# Patient Record
Sex: Female | Born: 1950 | Race: White | Hispanic: No | Marital: Married | Smoking: Former smoker
Health system: Southern US, Community
[De-identification: ages and names within clinical notes are randomized; demographics above are authoritative.]

## PROBLEM LIST (undated history)

## (undated) DIAGNOSIS — Z8659 Personal history of other mental and behavioral disorders: Secondary | ICD-10-CM

## (undated) DIAGNOSIS — M8580 Other specified disorders of bone density and structure, unspecified site: Secondary | ICD-10-CM

## (undated) DIAGNOSIS — E211 Secondary hyperparathyroidism, not elsewhere classified: Secondary | ICD-10-CM

## (undated) DIAGNOSIS — E785 Hyperlipidemia, unspecified: Secondary | ICD-10-CM

## (undated) DIAGNOSIS — M1990 Unspecified osteoarthritis, unspecified site: Secondary | ICD-10-CM

## (undated) HISTORY — DX: Hyperlipidemia, unspecified: E78.5

## (undated) HISTORY — PX: BUNIONECTOMY: SHX129

## (undated) HISTORY — DX: Secondary hyperparathyroidism, not elsewhere classified: E21.1

## (undated) HISTORY — PX: TONSILLECTOMY: SUR1361

## (undated) HISTORY — DX: Personal history of other mental and behavioral disorders: Z86.59

## (undated) HISTORY — DX: Other specified disorders of bone density and structure, unspecified site: M85.80

## (undated) HISTORY — PX: BREAST BIOPSY: SHX20

---

## 2007-05-02 ENCOUNTER — Other Ambulatory Visit: Admission: RE | Admit: 2007-05-02 | Discharge: 2007-05-02 | Payer: Self-pay | Admitting: Family Medicine

## 2007-06-07 ENCOUNTER — Encounter: Admission: RE | Admit: 2007-06-07 | Discharge: 2007-06-07 | Payer: Self-pay | Admitting: Family Medicine

## 2007-08-09 ENCOUNTER — Encounter: Admission: RE | Admit: 2007-08-09 | Discharge: 2007-08-09 | Payer: Self-pay | Admitting: Endocrinology

## 2008-05-02 ENCOUNTER — Other Ambulatory Visit: Admission: RE | Admit: 2008-05-02 | Discharge: 2008-05-02 | Payer: Self-pay | Admitting: Family Medicine

## 2008-05-30 HISTORY — PX: BREAST BIOPSY: SHX20

## 2008-07-16 ENCOUNTER — Encounter: Admission: RE | Admit: 2008-07-16 | Discharge: 2008-07-16 | Payer: Self-pay | Admitting: Family Medicine

## 2009-06-02 ENCOUNTER — Other Ambulatory Visit: Admission: RE | Admit: 2009-06-02 | Discharge: 2009-06-02 | Payer: Self-pay | Admitting: Family Medicine

## 2011-12-20 ENCOUNTER — Other Ambulatory Visit: Payer: Self-pay | Admitting: Family Medicine

## 2011-12-20 DIAGNOSIS — Z1231 Encounter for screening mammogram for malignant neoplasm of breast: Secondary | ICD-10-CM

## 2012-01-04 ENCOUNTER — Ambulatory Visit
Admission: RE | Admit: 2012-01-04 | Discharge: 2012-01-04 | Disposition: A | Discharge status: Home / Self Care | Payer: BC Managed Care – PPO | Source: Ambulatory Visit | Attending: Family Medicine | Admitting: Family Medicine

## 2012-01-04 ENCOUNTER — Other Ambulatory Visit: Payer: Self-pay | Admitting: Surgery

## 2012-01-04 DIAGNOSIS — Z1231 Encounter for screening mammogram for malignant neoplasm of breast: Secondary | ICD-10-CM

## 2012-01-09 ENCOUNTER — Other Ambulatory Visit: Payer: Self-pay | Admitting: Surgery

## 2012-01-09 DIAGNOSIS — R928 Other abnormal and inconclusive findings on diagnostic imaging of breast: Secondary | ICD-10-CM

## 2012-01-16 ENCOUNTER — Other Ambulatory Visit: Payer: Self-pay | Admitting: Surgery

## 2012-01-16 ENCOUNTER — Ambulatory Visit
Admission: RE | Admit: 2012-01-16 | Discharge: 2012-01-16 | Disposition: A | Discharge status: Home / Self Care | Payer: BC Managed Care – PPO | Source: Ambulatory Visit | Attending: Surgery | Admitting: Surgery

## 2012-01-16 DIAGNOSIS — N63 Unspecified lump in unspecified breast: Secondary | ICD-10-CM

## 2012-01-16 DIAGNOSIS — R928 Other abnormal and inconclusive findings on diagnostic imaging of breast: Secondary | ICD-10-CM

## 2012-01-18 ENCOUNTER — Other Ambulatory Visit: Payer: Self-pay | Admitting: Family Medicine

## 2012-01-18 DIAGNOSIS — R928 Other abnormal and inconclusive findings on diagnostic imaging of breast: Secondary | ICD-10-CM

## 2012-01-18 DIAGNOSIS — Z1231 Encounter for screening mammogram for malignant neoplasm of breast: Secondary | ICD-10-CM

## 2012-01-18 DIAGNOSIS — N63 Unspecified lump in unspecified breast: Secondary | ICD-10-CM

## 2012-01-19 ENCOUNTER — Ambulatory Visit
Admission: RE | Admit: 2012-01-19 | Discharge: 2012-01-19 | Disposition: A | Discharge status: Home / Self Care | Payer: BC Managed Care – PPO | Source: Ambulatory Visit | Attending: Surgery | Admitting: Surgery

## 2012-01-19 ENCOUNTER — Ambulatory Visit
Admission: RE | Admit: 2012-01-19 | Discharge: 2012-01-19 | Disposition: A | Discharge status: Home / Self Care | Payer: BC Managed Care – PPO | Source: Ambulatory Visit | Attending: Family Medicine | Admitting: Family Medicine

## 2012-01-19 ENCOUNTER — Other Ambulatory Visit: Payer: Self-pay | Admitting: Family Medicine

## 2012-01-19 DIAGNOSIS — N63 Unspecified lump in unspecified breast: Secondary | ICD-10-CM

## 2012-01-20 ENCOUNTER — Inpatient Hospital Stay: Admission: RE | Admit: 2012-01-20 | Payer: BC Managed Care – PPO | Source: Ambulatory Visit

## 2012-11-27 ENCOUNTER — Other Ambulatory Visit: Payer: Self-pay | Admitting: Family Medicine

## 2012-11-27 DIAGNOSIS — R928 Other abnormal and inconclusive findings on diagnostic imaging of breast: Secondary | ICD-10-CM

## 2013-01-04 ENCOUNTER — Ambulatory Visit
Admission: RE | Admit: 2013-01-04 | Discharge: 2013-01-04 | Disposition: A | Discharge status: Home / Self Care | Payer: BC Managed Care – PPO | Source: Ambulatory Visit | Attending: Family Medicine | Admitting: Family Medicine

## 2013-01-04 DIAGNOSIS — R928 Other abnormal and inconclusive findings on diagnostic imaging of breast: Secondary | ICD-10-CM

## 2014-01-07 ENCOUNTER — Other Ambulatory Visit: Payer: Self-pay

## 2014-01-07 DIAGNOSIS — Z1231 Encounter for screening mammogram for malignant neoplasm of breast: Secondary | ICD-10-CM

## 2014-01-15 ENCOUNTER — Ambulatory Visit
Admission: RE | Admit: 2014-01-15 | Discharge: 2014-01-15 | Disposition: A | Discharge status: Home / Self Care | Payer: Medicare Other | Source: Ambulatory Visit

## 2014-01-15 DIAGNOSIS — Z1231 Encounter for screening mammogram for malignant neoplasm of breast: Secondary | ICD-10-CM

## 2014-12-30 ENCOUNTER — Other Ambulatory Visit: Payer: Self-pay

## 2014-12-30 DIAGNOSIS — Z1231 Encounter for screening mammogram for malignant neoplasm of breast: Secondary | ICD-10-CM

## 2015-01-18 ENCOUNTER — Encounter (HOSPITAL_COMMUNITY): Payer: Self-pay | Admitting: Emergency Medicine

## 2015-01-18 ENCOUNTER — Emergency Department (HOSPITAL_COMMUNITY): Payer: Medicare Other

## 2015-01-18 ENCOUNTER — Emergency Department (HOSPITAL_COMMUNITY)
Admission: EM | Admit: 2015-01-18 | Discharge: 2015-01-18 | Disposition: A | Discharge status: Home / Self Care | Payer: Medicare Other | Attending: Emergency Medicine | Admitting: Emergency Medicine

## 2015-01-18 DIAGNOSIS — S90811A Abrasion, right foot, initial encounter: Secondary | ICD-10-CM | POA: Diagnosis not present

## 2015-01-18 DIAGNOSIS — Y998 Other external cause status: Secondary | ICD-10-CM | POA: Diagnosis not present

## 2015-01-18 DIAGNOSIS — Z23 Encounter for immunization: Secondary | ICD-10-CM | POA: Insufficient documentation

## 2015-01-18 DIAGNOSIS — Z87891 Personal history of nicotine dependence: Secondary | ICD-10-CM | POA: Diagnosis not present

## 2015-01-18 DIAGNOSIS — Y9289 Other specified places as the place of occurrence of the external cause: Secondary | ICD-10-CM | POA: Diagnosis not present

## 2015-01-18 DIAGNOSIS — S99921A Unspecified injury of right foot, initial encounter: Secondary | ICD-10-CM | POA: Diagnosis present

## 2015-01-18 DIAGNOSIS — W228XXA Striking against or struck by other objects, initial encounter: Secondary | ICD-10-CM | POA: Insufficient documentation

## 2015-01-18 DIAGNOSIS — Y9389 Activity, other specified: Secondary | ICD-10-CM | POA: Diagnosis not present

## 2015-01-18 DIAGNOSIS — T148XXA Other injury of unspecified body region, initial encounter: Secondary | ICD-10-CM

## 2015-01-18 DIAGNOSIS — Z79899 Other long term (current) drug therapy: Secondary | ICD-10-CM | POA: Insufficient documentation

## 2015-01-18 MED ORDER — TETANUS-DIPHTH-ACELL PERTUSSIS 5-2.5-18.5 LF-MCG/0.5 IM SUSP
0.5000 mL | Freq: Once | INTRAMUSCULAR | Status: AC
Start: 2015-01-18 — End: 2015-01-18
  Administered 2015-01-18: 0.5 mL via INTRAMUSCULAR
  Filled 2015-01-18: qty 0.5

## 2015-01-18 NOTE — Discharge Instructions (Signed)
Abrasions Cheryl Mcpherson, your xray does not show any broken bones or misalignment of the hardware in your foot.  Take a tylenol or ibuprofen as needed for pain control.  See a primary care doctor within 3 days for close follow up. If any symptoms worsen, come back to the ED immediately. Thank you. An abrasion is a cut or scrape of the skin. Abrasions do not go through all layers of the skin. HOME CARE  If a bandage (dressing) was put on your wound, change it as told by your doctor. If the bandage sticks, soak it off with warm.  Wash the area with water and soap 2 times a day. Rinse off the soap. Pat the area dry with a clean towel.  Put on medicated cream (ointment) as told by your doctor.  Change your bandage right away if it gets wet or dirty.  Only take medicine as told by your doctor.  See your doctor within 24-48 hours to get your wound checked.  Check your wound for redness, puffiness (swelling), or yellowish-white fluid (pus). GET HELP RIGHT AWAY IF:   You have more pain in the wound.  You have redness, swelling, or tenderness around the wound.  You have pus coming from the wound.  You have a fever or lasting symptoms for more than 2-3 days.  You have a fever and your symptoms suddenly get worse.  You have a bad smell coming from the wound or bandage. MAKE SURE YOU:   Understand these instructions.  Will watch your condition.  Will get help right away if you are not doing well or get worse. Document Released: 11/02/2007 Document Revised: 02/08/2012 Document Reviewed: 04/19/2011 [facility] Patient Information 2015 [place], [place]. This information is not intended to replace advice given to you by your health care provider. Make sure you discuss any questions you have with your health care provider.

## 2015-01-18 NOTE — ED Provider Notes (Signed)
CSN: [id number]     Arrival date & time 01/18/15  0422 History   First MD Initiated Contact with Patient 01/18/15 [id number]     Chief Complaint  Patient presents with  . Foot Pain     (Consider location/radiation/quality/duration/timing/severity/associated sxs/prior Treatment) HPI   Ms. Tabak is a 64yo female, no sig PMH, here with foot pain.  Around 6pm, she kicked a ball and has a small lac on her right foot.  She had surgery in 2007 with screws and is concerned that they may have poked out through the skin.  She did not take any medicines for this.  She denies other injuries, patient has no further complaints.   10 Systems reviewed and are negative for acute change except as noted in the HPI.     History reviewed. No pertinent past medical history. Past Surgical History  Procedure Laterality Date  . Bunionectomy Bilateral Rt foot 11/2005; left foot 06/2006   No family history on file. Social History  Substance Use Topics  . Smoking status: Former Smoker    Quit date: 12/29/2007  . Smokeless tobacco: None  . Alcohol Use: Yes     Comment: rarely   OB History    No data available     Review of Systems    Allergies  Sulfa antibiotics  Home Medications   Prior to Admission medications   Medication Sig Start Date End Date Taking? Authorizing Provider  simvastatin (ZOCOR) 40 MG tablet Take 40 mg by mouth daily.   Yes Historical Provider, MD   BP 132/72 mmHg  Pulse 68  Temp(Src) 97.3 F (36.3 C) (Oral)  Ht 5\' 7"  (1.702 m)  Wt 205 lb (92.987 kg)  BMI 32.10 kg/m2  SpO2 95%  LMP 12/28/1996 Physical Exam  Constitutional: She is oriented to person, place, and time. She appears well-developed and well-nourished. No distress.  HENT:  Head: Normocephalic and atraumatic.  Nose: Nose normal.  Mouth/Throat: Oropharynx is clear and moist. No oropharyngeal exudate.  Eyes: Conjunctivae and EOM are normal. Pupils are equal, round, and reactive to light. No scleral icterus.   Neck: Normal range of motion. Neck supple. No JVD present. No tracheal deviation present. No thyromegaly present.  Cardiovascular: Normal rate, regular rhythm and normal heart sounds.  Exam reveals no gallop and no friction rub.   No murmur heard. Pulmonary/Chest: Effort normal and breath sounds normal. No respiratory distress. She has no wheezes. She exhibits no tenderness.  Abdominal: Soft. Bowel sounds are normal. She exhibits no distension and no mass. There is no tenderness. There is no rebound and no guarding.  Musculoskeletal: Normal range of motion. She exhibits no edema or tenderness.  Small 0.5cm lac to dorsum of right foot, no sig swelling or hematoma formation.  Normal pulses and sensation distally.  No TTP.  Lymphadenopathy:    She has no cervical adenopathy.  Neurological: She is alert and oriented to person, place, and time. No cranial nerve deficit. She exhibits normal muscle tone.  Skin: Skin is warm and dry. No rash noted. No erythema. No pallor.  Nursing note and vitals reviewed.   ED Course  Procedures (including critical care time) Labs Review Labs Reviewed - No data to display  Imaging Review Dg Foot Complete Right  01/18/2015   CLINICAL DATA:  RIGHT foot pain after kicking a ball in yard with dog. Prior foot surgery.  EXAM: RIGHT FOOT COMPLETE - 3+ VIEW  COMPARISON:  None.  FINDINGS: No acute fracture deformity or dislocation.  Status post distal first metatarsus osteotomy/bunionectomy. Cerclage wire projecting at first proximal phalanx, status post osteotomy. Two intact partially threaded cannulated screws and base of first metatarsus. No dislocation. Small plantar calcaneal spur. No destructive bony lesions. Soft tissue planes are nonsuspicious.  IMPRESSION: No acute fracture deformity or dislocation.  Status post first metatarsus and first proximal phalanx osteotomies.   Electronically Signed   By: Elon Alas M.D.   On: 01/18/2015 05:33   I have personally  reviewed and evaluated these images and lab results as part of my medical decision-making.   EKG Interpretation None      MDM   Final diagnoses:  None   Patient presents to the ED for a cut on her foot after kicking a ball.  Xr is negative for fracture or misalignment of hardware. Tetanus was updated.  She appears well and in NAD.  Her VS remain wtihin her normal limits and she is safe for DC.      Everlene Balls, MD 01/18/15 [phone]

## 2015-01-18 NOTE — ED Notes (Signed)
Patient states she was playing out in back yard with dog and kicked her ball. Patient states that her foot made contact with filling stem and she felt immediate pain with a skin break at the site. Patient states that she has had bunion surgery in the past and has a screw in her foot. Patient states she iced injury and took 3 500 mg ibuprofen after.

## 2015-01-23 ENCOUNTER — Ambulatory Visit
Admission: RE | Admit: 2015-01-23 | Discharge: 2015-01-23 | Disposition: A | Discharge status: Home / Self Care | Payer: Medicare Other | Source: Ambulatory Visit

## 2015-01-23 DIAGNOSIS — Z1231 Encounter for screening mammogram for malignant neoplasm of breast: Secondary | ICD-10-CM

## 2015-09-11 ENCOUNTER — Other Ambulatory Visit: Payer: Self-pay | Admitting: Emergency Medicine

## 2015-09-11 DIAGNOSIS — N950 Postmenopausal bleeding: Secondary | ICD-10-CM

## 2015-09-16 ENCOUNTER — Ambulatory Visit
Admission: RE | Admit: 2015-09-16 | Discharge: 2015-09-16 | Disposition: A | Discharge status: Home / Self Care | Payer: Medicare Other | Source: Ambulatory Visit | Attending: Emergency Medicine | Admitting: Emergency Medicine

## 2015-09-16 DIAGNOSIS — N950 Postmenopausal bleeding: Secondary | ICD-10-CM

## 2015-09-23 ENCOUNTER — Other Ambulatory Visit: Payer: Self-pay | Admitting: Obstetrics and Gynecology

## 2015-12-22 ENCOUNTER — Other Ambulatory Visit: Payer: Self-pay | Admitting: Family Medicine

## 2015-12-22 DIAGNOSIS — Z1231 Encounter for screening mammogram for malignant neoplasm of breast: Secondary | ICD-10-CM

## 2016-01-26 ENCOUNTER — Ambulatory Visit: Payer: Medicare Other

## 2016-01-28 ENCOUNTER — Ambulatory Visit
Admission: RE | Admit: 2016-01-28 | Discharge: 2016-01-28 | Disposition: A | Discharge status: Home / Self Care | Payer: Medicare Other | Source: Ambulatory Visit | Attending: Family Medicine | Admitting: Family Medicine

## 2016-01-28 DIAGNOSIS — Z1231 Encounter for screening mammogram for malignant neoplasm of breast: Secondary | ICD-10-CM

## 2017-01-04 ENCOUNTER — Other Ambulatory Visit: Payer: Self-pay | Admitting: Family Medicine

## 2017-01-04 DIAGNOSIS — Z1231 Encounter for screening mammogram for malignant neoplasm of breast: Secondary | ICD-10-CM

## 2017-01-31 ENCOUNTER — Ambulatory Visit
Admission: RE | Admit: 2017-01-31 | Discharge: 2017-01-31 | Disposition: A | Discharge status: Home / Self Care | Payer: Medicare Other | Source: Ambulatory Visit | Attending: Family Medicine | Admitting: Family Medicine

## 2017-01-31 DIAGNOSIS — Z1231 Encounter for screening mammogram for malignant neoplasm of breast: Secondary | ICD-10-CM

## 2017-04-29 ENCOUNTER — Other Ambulatory Visit: Payer: Self-pay | Admitting: Family Medicine

## 2017-04-29 DIAGNOSIS — R590 Localized enlarged lymph nodes: Secondary | ICD-10-CM

## 2017-04-29 HISTORY — PX: BREAST BIOPSY: SHX20

## 2017-05-05 ENCOUNTER — Ambulatory Visit
Admission: RE | Admit: 2017-05-05 | Discharge: 2017-05-05 | Disposition: A | Discharge status: Home / Self Care | Payer: Medicare Other | Source: Ambulatory Visit | Attending: Family Medicine | Admitting: Family Medicine

## 2017-05-05 ENCOUNTER — Other Ambulatory Visit: Payer: Self-pay | Admitting: Family Medicine

## 2017-05-05 DIAGNOSIS — R590 Localized enlarged lymph nodes: Secondary | ICD-10-CM

## 2017-05-12 ENCOUNTER — Other Ambulatory Visit (HOSPITAL_COMMUNITY)
Admission: RE | Admit: 2017-05-12 | Discharge: 2017-05-12 | Disposition: A | Discharge status: Home / Self Care | Payer: Medicare Other | Source: Ambulatory Visit | Attending: Body Imaging | Admitting: Body Imaging

## 2017-05-12 ENCOUNTER — Ambulatory Visit
Admission: RE | Admit: 2017-05-12 | Discharge: 2017-05-12 | Disposition: A | Discharge status: Home / Self Care | Payer: Medicare Other | Source: Ambulatory Visit | Attending: Family Medicine | Admitting: Family Medicine

## 2017-05-12 ENCOUNTER — Other Ambulatory Visit: Payer: Self-pay | Admitting: Family Medicine

## 2017-05-12 DIAGNOSIS — R590 Localized enlarged lymph nodes: Secondary | ICD-10-CM

## 2018-01-04 ENCOUNTER — Other Ambulatory Visit: Payer: Self-pay | Admitting: Physician Assistant

## 2018-01-04 DIAGNOSIS — Z1231 Encounter for screening mammogram for malignant neoplasm of breast: Secondary | ICD-10-CM

## 2018-02-01 ENCOUNTER — Ambulatory Visit
Admission: RE | Admit: 2018-02-01 | Discharge: 2018-02-01 | Disposition: A | Discharge status: Home / Self Care | Payer: Medicare Other | Source: Ambulatory Visit | Attending: Physician Assistant | Admitting: Physician Assistant

## 2018-02-01 DIAGNOSIS — Z1231 Encounter for screening mammogram for malignant neoplasm of breast: Secondary | ICD-10-CM

## 2018-02-01 NOTE — Pre-Procedure Instructions (Signed)
Cheryl Mcpherson  02/01/2018      [id number] - [place], [place] - [address] AT [place] [address] [place]  [address] Phone: [phone] Fax: [phone]    Your procedure is scheduled on Thursday September 12th.  Report to [facility] at 0800 A.M.  Call this number if you have problems the morning of surgery:  [phone]   Remember:  Do not eat or drink after midnight.    Do not take any medications the morning of surgery.  7 days prior to surgery STOP taking any Aspirin(unless otherwise instructed by your surgeon), Aleve, Naproxen, Ibuprofen, Motrin, Advil, Goody's, BC's, all herbal medications, fish oil, and all vitamins     Do not wear jewelry, make-up or nail polish.  Do not wear lotions, powders, or perfumes, or deodorant.  Do not shave 48 hours prior to surgery.  Men may shave face and neck.  Do not bring valuables to the hospital.  [facility] is not responsible for any belongings or valuables.  Contacts, dentures or bridgework may not be worn into surgery.  Leave your suitcase in the car.  After surgery it may be brought to your room.  For patients admitted to the hospital, discharge time will be determined by your treatment team.  Patients discharged the day of surgery will not be allowed to drive home.    [place]- Preparing For Surgery  Before surgery, you can play an important role. Because skin is not sterile, your skin needs to be as free of germs as possible. You can reduce the number of germs on your skin by washing with CHG (chlorahexidine gluconate) Soap before surgery.  CHG is an antiseptic cleaner which kills germs and bonds with the skin to continue killing germs even after washing.    Oral Hygiene is also important to reduce your risk of infection.  Remember - BRUSH YOUR TEETH THE MORNING OF SURGERY WITH YOUR REGULAR TOOTHPASTE  Please do not use if you have an allergy to CHG or antibacterial  soaps. If your skin becomes reddened/irritated stop using the CHG.  Do not shave (including legs and underarms) for at least 48 hours prior to first CHG shower. It is OK to shave your face.  Please follow these instructions carefully.   1. Shower the NIGHT BEFORE SURGERY and the MORNING OF SURGERY with CHG.   2. If you chose to wash your hair, wash your hair first as usual with your normal shampoo.  3. After you shampoo, rinse your hair and body thoroughly to remove the shampoo.  4. Use CHG as you would any other liquid soap. You can apply CHG directly to the skin and wash gently with a scrungie or a clean washcloth.   5. Apply the CHG Soap to your body ONLY FROM THE NECK DOWN.  Do not use on open wounds or open sores. Avoid contact with your eyes, ears, mouth and genitals (private parts). Wash Face and genitals (private parts)  with your normal soap.  6. Wash thoroughly, paying special attention to the area where your surgery will be performed.  7. Thoroughly rinse your body with warm water from the neck down.  8. DO NOT shower/wash with your normal soap after using and rinsing off the CHG Soap.  9. Pat yourself dry with a CLEAN TOWEL.  10. Wear CLEAN PAJAMAS to bed the night before surgery, wear comfortable clothes the morning of surgery  11.  Place CLEAN SHEETS on your bed the night of your first shower and DO NOT SLEEP WITH PETS.    Day of Surgery:  Do not apply any deodorants/lotions.  Please wear clean clothes to the hospital/surgery center.   Remember to brush your teeth WITH YOUR REGULAR TOOTHPASTE.    Please read over the following fact sheets that you were given.

## 2018-02-02 ENCOUNTER — Encounter (HOSPITAL_COMMUNITY): Payer: Self-pay

## 2018-02-02 ENCOUNTER — Encounter (HOSPITAL_COMMUNITY)
Admission: RE | Admit: 2018-02-02 | Discharge: 2018-02-02 | Disposition: A | Discharge status: Home / Self Care | Payer: Medicare Other | Source: Ambulatory Visit | Attending: Orthopedic Surgery | Admitting: Orthopedic Surgery

## 2018-02-02 ENCOUNTER — Other Ambulatory Visit: Payer: Self-pay

## 2018-02-02 DIAGNOSIS — Z79899 Other long term (current) drug therapy: Secondary | ICD-10-CM | POA: Insufficient documentation

## 2018-02-02 DIAGNOSIS — Z7982 Long term (current) use of aspirin: Secondary | ICD-10-CM | POA: Diagnosis not present

## 2018-02-02 DIAGNOSIS — M75101 Unspecified rotator cuff tear or rupture of right shoulder, not specified as traumatic: Secondary | ICD-10-CM | POA: Diagnosis not present

## 2018-02-02 DIAGNOSIS — Z87891 Personal history of nicotine dependence: Secondary | ICD-10-CM | POA: Diagnosis not present

## 2018-02-02 DIAGNOSIS — Z01812 Encounter for preprocedural laboratory examination: Secondary | ICD-10-CM | POA: Diagnosis present

## 2018-02-02 HISTORY — DX: Unspecified osteoarthritis, unspecified site: M19.90

## 2018-02-02 LAB — SURGICAL PCR SCREEN
MRSA, PCR: NEGATIVE
STAPHYLOCOCCUS AUREUS: NEGATIVE

## 2018-02-02 NOTE — Progress Notes (Signed)
   PCP  Mat Carne  PA-C   Pt. Has labs and EKG done in PCP office 01-25-2018.  Labs and EKG are in chart with medical clearance.  Denies any cardiac history or testing.  Will send chart to anesthesia for review since there is no lab work in [facility]

## 2018-02-05 ENCOUNTER — Other Ambulatory Visit: Payer: Self-pay | Admitting: Physician Assistant

## 2018-02-05 DIAGNOSIS — R928 Other abnormal and inconclusive findings on diagnostic imaging of breast: Secondary | ICD-10-CM

## 2018-02-05 NOTE — Progress Notes (Signed)
Anesthesia Chart Review:  Case:  [id number] Date/Time:  02/08/18 0945   Procedure:  REVERSE RIGHT SHOULDER ARTHROPLASTY (Right Shoulder)   Anesthesia type:  General   Pre-op diagnosis:  Right shoulder rotator cuff tear arthropathy   Location:  [facility] OR ROOM 06 / [place] [facility]   Surgeon:  Justice Britain, MD      DISCUSSION: Patient is a 67 year old female scheduled for the above procedure.   History includes former smoker (quit '09), arthritis. BMI is consistent with obesity.   PCP Camille Bal, PA-C signed medical clearance letter at "low risk" following office visit with labs and EKG. She was given permission to hold ASA for 10 days prior to surgery. (Lab results outlined below.)  If no acute changes then I anticipate that she can proceed as planned.    VS: BP 135/62   Pulse (!) 56   Temp 36.9 C (Oral)   Ht 5\' 6"  (1.676 m)   Wt 98.9 kg   LMP 12/28/1996   SpO2 97%   BMI 35.19 kg/m   PROVIDERS: Camille Bal, PA-C is PCP at [facility].   LABS: Labs from [facility] on 01/25/18 showed: Glucose 108, BUN 10, creatinine 0.73, sodium 146, potassium 4.6, chloride 107, CO2 32, anion gap 11.7, calcium 10.8, calcium-Albumin corrected 10.77, total protein 6.7, albumin 4.0, total bilirubin 0.4, alkaline phosphatase 82, AST 19, ALT 19, WBC 8.4, hemoglobin 13.3, hematocrit 40.7, platelets 305, A1c 5.7.  UA negative for leukocytes and nitrites.  Labs Reviewed  SURGICAL PCR SCREEN    EKG: 01/25/18 [facility]): SR, left atrial enlargement. Anteroseptal infarct (age undetermined).    CV: N/A  Past Medical History:  Diagnosis Date  . Arthritis     Past Surgical History:  Procedure Laterality Date  . BREAST BIOPSY Left   . BUNIONECTOMY Bilateral Rt foot 11/2005; left foot 06/2006  . TONSILLECTOMY      MEDICATIONS: . aspirin EC 81 MG tablet  . Cholecalciferol (VITAMIN D3 GUMMIES ADULT PO)  . Multiple Vitamins-Minerals (ADULT GUMMY PO)  . Omega-3 Fatty  Acids (OMEGA 3 PO)  . Probiotic Product (RESTORA) CAPS  . simvastatin (ZOCOR) 20 MG tablet   No current facility-administered medications for this encounter.     [facility] Short Stay [facility]/Anesthesiology Phone [phone] 02/05/2018 11:12 AM

## 2018-02-06 ENCOUNTER — Ambulatory Visit
Admission: RE | Admit: 2018-02-06 | Discharge: 2018-02-06 | Disposition: A | Discharge status: Home / Self Care | Payer: Medicare Other | Source: Ambulatory Visit | Attending: Physician Assistant | Admitting: Physician Assistant

## 2018-02-06 ENCOUNTER — Ambulatory Visit: Admission: RE | Admit: 2018-02-06 | Payer: Medicare Other | Source: Ambulatory Visit

## 2018-02-06 DIAGNOSIS — R928 Other abnormal and inconclusive findings on diagnostic imaging of breast: Secondary | ICD-10-CM

## 2018-02-07 MED ORDER — CEFAZOLIN SODIUM-DEXTROSE 2-4 GM/100ML-% IV SOLN
2.0000 g | INTRAVENOUS | Status: AC
  Administered 2018-02-08: 2 g via INTRAVENOUS
  Filled 2018-02-07: qty 100

## 2018-02-07 MED ORDER — TRANEXAMIC ACID 1000 MG/10ML IV SOLN
1000.0000 mg | INTRAVENOUS | Status: AC
  Administered 2018-02-08: 1000 mg via INTRAVENOUS
  Filled 2018-02-07: qty 1000

## 2018-02-08 ENCOUNTER — Inpatient Hospital Stay (HOSPITAL_COMMUNITY): Payer: Medicare Other | Admitting: Vascular Surgery

## 2018-02-08 ENCOUNTER — Encounter (HOSPITAL_COMMUNITY)
Admission: RE | Disposition: A | Discharge status: Home / Self Care | Payer: Self-pay | Source: Home / Self Care | Attending: Orthopedic Surgery

## 2018-02-08 ENCOUNTER — Inpatient Hospital Stay (HOSPITAL_COMMUNITY): Payer: Medicare Other | Admitting: Certified Registered Nurse Anesthetist

## 2018-02-08 ENCOUNTER — Encounter (HOSPITAL_COMMUNITY): Payer: Self-pay

## 2018-02-08 ENCOUNTER — Inpatient Hospital Stay (HOSPITAL_COMMUNITY)
Admission: RE | Admit: 2018-02-08 | Discharge: 2018-02-09 | DRG: 483 | Disposition: A | Discharge status: Home / Self Care | Payer: Medicare Other | Attending: Orthopedic Surgery | Admitting: Orthopedic Surgery

## 2018-02-08 DIAGNOSIS — Z6835 Body mass index (BMI) 35.0-35.9, adult: Secondary | ICD-10-CM

## 2018-02-08 DIAGNOSIS — Z87891 Personal history of nicotine dependence: Secondary | ICD-10-CM

## 2018-02-08 DIAGNOSIS — Z7982 Long term (current) use of aspirin: Secondary | ICD-10-CM

## 2018-02-08 DIAGNOSIS — E785 Hyperlipidemia, unspecified: Secondary | ICD-10-CM | POA: Diagnosis present

## 2018-02-08 DIAGNOSIS — Z882 Allergy status to sulfonamides status: Secondary | ICD-10-CM | POA: Diagnosis not present

## 2018-02-08 DIAGNOSIS — M75101 Unspecified rotator cuff tear or rupture of right shoulder, not specified as traumatic: Secondary | ICD-10-CM | POA: Diagnosis present

## 2018-02-08 DIAGNOSIS — E669 Obesity, unspecified: Secondary | ICD-10-CM | POA: Diagnosis present

## 2018-02-08 DIAGNOSIS — Z79899 Other long term (current) drug therapy: Secondary | ICD-10-CM

## 2018-02-08 DIAGNOSIS — M1990 Unspecified osteoarthritis, unspecified site: Secondary | ICD-10-CM | POA: Diagnosis present

## 2018-02-08 DIAGNOSIS — Z96619 Presence of unspecified artificial shoulder joint: Secondary | ICD-10-CM

## 2018-02-08 DIAGNOSIS — Z96611 Presence of right artificial shoulder joint: Secondary | ICD-10-CM

## 2018-02-08 HISTORY — PX: REVERSE SHOULDER ARTHROPLASTY: SHX5054

## 2018-02-08 SURGERY — ARTHROPLASTY, SHOULDER, TOTAL, REVERSE
Anesthesia: Regional | Site: Shoulder | Laterality: Right

## 2018-02-08 MED ORDER — ACETAMINOPHEN 325 MG PO TABS: 325.0000 mg | ORAL_TABLET | Freq: Four times a day (QID) | ORAL | Status: DC | PRN

## 2018-02-08 MED ORDER — FENTANYL CITRATE (PF) 100 MCG/2ML IJ SOLN
INTRAMUSCULAR | Status: DC | PRN
  Administered 2018-02-08: 25 ug via INTRAVENOUS
  Administered 2018-02-08: 50 ug via INTRAVENOUS
  Administered 2018-02-08: 25 ug via INTRAVENOUS
  Administered 2018-02-08: 50 ug via INTRAVENOUS

## 2018-02-08 MED ORDER — POLYETHYLENE GLYCOL 3350 17 G PO PACK: 17.0000 g | PACK | Freq: Every day | ORAL | Status: DC | PRN

## 2018-02-08 MED ORDER — MENTHOL 3 MG MT LOZG: 1.0000 | LOZENGE | OROMUCOSAL | Status: DC | PRN

## 2018-02-08 MED ORDER — METOCLOPRAMIDE HCL 5 MG/ML IJ SOLN: 5.0000 mg | Freq: Three times a day (TID) | INTRAMUSCULAR | Status: DC | PRN

## 2018-02-08 MED ORDER — SODIUM CHLORIDE 0.9 % IV SOLN
INTRAVENOUS | Status: DC | PRN
  Administered 2018-02-08: 40 ug/min via INTRAVENOUS

## 2018-02-08 MED ORDER — ONDANSETRON HCL 4 MG/2ML IJ SOLN: 4.0000 mg | Freq: Four times a day (QID) | INTRAMUSCULAR | Status: DC | PRN

## 2018-02-08 MED ORDER — LACTATED RINGERS IV SOLN
INTRAVENOUS | Status: DC
  Administered 2018-02-08: 14:00:00 via INTRAVENOUS

## 2018-02-08 MED ORDER — PHENYLEPHRINE 40 MCG/ML (10ML) SYRINGE FOR IV PUSH (FOR BLOOD PRESSURE SUPPORT)
PREFILLED_SYRINGE | INTRAVENOUS | Status: DC | PRN
  Administered 2018-02-08 (×5): 80 ug via INTRAVENOUS

## 2018-02-08 MED ORDER — METHOCARBAMOL 1000 MG/10ML IJ SOLN
500.0000 mg | Freq: Four times a day (QID) | INTRAVENOUS | Status: DC | PRN
  Filled 2018-02-08: qty 5

## 2018-02-08 MED ORDER — ESMOLOL HCL 100 MG/10ML IV SOLN
INTRAVENOUS | Status: AC
  Filled 2018-02-08: qty 10

## 2018-02-08 MED ORDER — PROPOFOL 10 MG/ML IV BOLUS
INTRAVENOUS | Status: AC
  Filled 2018-02-08: qty 20

## 2018-02-08 MED ORDER — HYDROMORPHONE HCL 1 MG/ML IJ SOLN: 0.5000 mg | INTRAMUSCULAR | Status: DC | PRN

## 2018-02-08 MED ORDER — ONDANSETRON HCL 4 MG PO TABS: 4.0000 mg | ORAL_TABLET | Freq: Four times a day (QID) | ORAL | Status: DC | PRN

## 2018-02-08 MED ORDER — PROPOFOL 10 MG/ML IV BOLUS
INTRAVENOUS | Status: DC | PRN
  Administered 2018-02-08: 200 mg via INTRAVENOUS

## 2018-02-08 MED ORDER — ONDANSETRON HCL 4 MG/2ML IJ SOLN: 4.0000 mg | Freq: Once | INTRAMUSCULAR | Status: DC | PRN

## 2018-02-08 MED ORDER — BUPIVACAINE LIPOSOME 1.3 % IJ SUSP
INTRAMUSCULAR | Status: DC | PRN
  Administered 2018-02-08: 10 mL via PERINEURAL

## 2018-02-08 MED ORDER — LACTATED RINGERS IV SOLN
INTRAVENOUS | Status: DC
  Administered 2018-02-08: 09:00:00 via INTRAVENOUS

## 2018-02-08 MED ORDER — ASPIRIN EC 81 MG PO TBEC
81.0000 mg | DELAYED_RELEASE_TABLET | Freq: Every day | ORAL | Status: DC
  Administered 2018-02-08 – 2018-02-09 (×2): 81 mg via ORAL
  Filled 2018-02-08 (×2): qty 1

## 2018-02-08 MED ORDER — ROCURONIUM BROMIDE 50 MG/5ML IV SOSY
PREFILLED_SYRINGE | INTRAVENOUS | Status: AC
  Filled 2018-02-08: qty 5

## 2018-02-08 MED ORDER — ONDANSETRON HCL 4 MG/2ML IJ SOLN
INTRAMUSCULAR | Status: AC
  Filled 2018-02-08: qty 2

## 2018-02-08 MED ORDER — DOCUSATE SODIUM 100 MG PO CAPS
100.0000 mg | ORAL_CAPSULE | Freq: Two times a day (BID) | ORAL | Status: DC
  Administered 2018-02-08 – 2018-02-09 (×3): 100 mg via ORAL
  Filled 2018-02-08 (×3): qty 1

## 2018-02-08 MED ORDER — SIMVASTATIN 20 MG PO TABS
20.0000 mg | ORAL_TABLET | Freq: Every day | ORAL | Status: DC
  Administered 2018-02-08: 20 mg via ORAL
  Filled 2018-02-08: qty 1

## 2018-02-08 MED ORDER — 0.9 % SODIUM CHLORIDE (POUR BTL) OPTIME
TOPICAL | Status: DC | PRN
  Administered 2018-02-08: 1000 mL

## 2018-02-08 MED ORDER — KETOROLAC TROMETHAMINE 15 MG/ML IJ SOLN
INTRAMUSCULAR | Status: AC
  Filled 2018-02-08: qty 1

## 2018-02-08 MED ORDER — METOCLOPRAMIDE HCL 5 MG PO TABS: 5.0000 mg | ORAL_TABLET | Freq: Three times a day (TID) | ORAL | Status: DC | PRN

## 2018-02-08 MED ORDER — MIDAZOLAM HCL 2 MG/2ML IJ SOLN
1.0000 mg | Freq: Once | INTRAMUSCULAR | Status: AC
  Administered 2018-02-08: 1 mg via INTRAVENOUS

## 2018-02-08 MED ORDER — KETOROLAC TROMETHAMINE 15 MG/ML IJ SOLN
7.5000 mg | Freq: Four times a day (QID) | INTRAMUSCULAR | Status: AC
  Administered 2018-02-08 – 2018-02-09 (×4): 7.5 mg via INTRAVENOUS
  Filled 2018-02-08 (×3): qty 1

## 2018-02-08 MED ORDER — METHOCARBAMOL 500 MG PO TABS
500.0000 mg | ORAL_TABLET | Freq: Four times a day (QID) | ORAL | Status: DC | PRN
  Administered 2018-02-08: 500 mg via ORAL

## 2018-02-08 MED ORDER — BUPIVACAINE HCL (PF) 0.5 % IJ SOLN
INTRAMUSCULAR | Status: DC | PRN
  Administered 2018-02-08: 15 mL via PERINEURAL

## 2018-02-08 MED ORDER — PHENOL 1.4 % MT LIQD: 1.0000 | OROMUCOSAL | Status: DC | PRN

## 2018-02-08 MED ORDER — DIPHENHYDRAMINE HCL 12.5 MG/5ML PO ELIX: 12.5000 mg | ORAL_SOLUTION | ORAL | Status: DC | PRN

## 2018-02-08 MED ORDER — DEXAMETHASONE SODIUM PHOSPHATE 10 MG/ML IJ SOLN
INTRAMUSCULAR | Status: DC | PRN
  Administered 2018-02-08: 10 mg via INTRAVENOUS

## 2018-02-08 MED ORDER — OXYCODONE HCL 5 MG PO TABS: 5.0000 mg | ORAL_TABLET | ORAL | Status: DC | PRN

## 2018-02-08 MED ORDER — TEMAZEPAM 15 MG PO CAPS
15.0000 mg | ORAL_CAPSULE | Freq: Every evening | ORAL | Status: DC | PRN
  Administered 2018-02-08: 15 mg via ORAL
  Filled 2018-02-08: qty 1

## 2018-02-08 MED ORDER — MAGNESIUM CITRATE PO SOLN: 1.0000 | Freq: Once | ORAL | Status: DC | PRN

## 2018-02-08 MED ORDER — BISACODYL 5 MG PO TBEC: 5.0000 mg | DELAYED_RELEASE_TABLET | Freq: Every day | ORAL | Status: DC | PRN

## 2018-02-08 MED ORDER — ALUM & MAG HYDROXIDE-SIMETH 200-200-20 MG/5ML PO SUSP: 30.0000 mL | ORAL | Status: DC | PRN

## 2018-02-08 MED ORDER — DEXAMETHASONE SODIUM PHOSPHATE 10 MG/ML IJ SOLN
INTRAMUSCULAR | Status: AC
  Filled 2018-02-08: qty 1

## 2018-02-08 MED ORDER — EPHEDRINE 5 MG/ML INJ
INTRAVENOUS | Status: AC
  Filled 2018-02-08: qty 10

## 2018-02-08 MED ORDER — ONDANSETRON HCL 4 MG/2ML IJ SOLN
INTRAMUSCULAR | Status: DC | PRN
  Administered 2018-02-08: 4 mg via INTRAVENOUS

## 2018-02-08 MED ORDER — FENTANYL CITRATE (PF) 100 MCG/2ML IJ SOLN
INTRAMUSCULAR | Status: AC
  Filled 2018-02-08: qty 2

## 2018-02-08 MED ORDER — SUGAMMADEX SODIUM 200 MG/2ML IV SOLN
INTRAVENOUS | Status: DC | PRN
  Administered 2018-02-08: 200 mg via INTRAVENOUS

## 2018-02-08 MED ORDER — PHENYLEPHRINE 40 MCG/ML (10ML) SYRINGE FOR IV PUSH (FOR BLOOD PRESSURE SUPPORT)
PREFILLED_SYRINGE | INTRAVENOUS | Status: AC
  Filled 2018-02-08: qty 10

## 2018-02-08 MED ORDER — CHLORHEXIDINE GLUCONATE 4 % EX LIQD: 60.0000 mL | Freq: Once | CUTANEOUS | Status: DC

## 2018-02-08 MED ORDER — MIDAZOLAM HCL 2 MG/2ML IJ SOLN
INTRAMUSCULAR | Status: AC
  Administered 2018-02-08: 1 mg via INTRAVENOUS
  Filled 2018-02-08: qty 2

## 2018-02-08 MED ORDER — OXYCODONE HCL 5 MG PO TABS
5.0000 mg | ORAL_TABLET | ORAL | Status: DC | PRN
  Administered 2018-02-09: 5 mg via ORAL
  Filled 2018-02-08: qty 1

## 2018-02-08 MED ORDER — METHOCARBAMOL 500 MG PO TABS
ORAL_TABLET | ORAL | Status: AC
  Filled 2018-02-08: qty 1

## 2018-02-08 MED ORDER — ACETAMINOPHEN 325 MG PO TABS
325.0000 mg | ORAL_TABLET | Freq: Four times a day (QID) | ORAL | Status: DC | PRN
  Administered 2018-02-08 – 2018-02-09 (×2): 650 mg via ORAL
  Filled 2018-02-08 (×3): qty 2

## 2018-02-08 MED ORDER — ROCURONIUM BROMIDE 50 MG/5ML IV SOSY
PREFILLED_SYRINGE | INTRAVENOUS | Status: DC | PRN
  Administered 2018-02-08: 40 mg via INTRAVENOUS

## 2018-02-08 MED ORDER — FENTANYL CITRATE (PF) 100 MCG/2ML IJ SOLN: 25.0000 ug | INTRAMUSCULAR | Status: DC | PRN

## 2018-02-08 MED ORDER — ATORVASTATIN CALCIUM 20 MG PO TABS
20.0000 mg | ORAL_TABLET | Freq: Every day | ORAL | Status: DC
  Filled 2018-02-08: qty 1

## 2018-02-08 MED ORDER — FENTANYL CITRATE (PF) 250 MCG/5ML IJ SOLN
INTRAMUSCULAR | Status: AC
  Filled 2018-02-08: qty 5

## 2018-02-08 SURGICAL SUPPLY — 51 items
BASEPLATE GLENOID SHLDR SM (Shoulder) ×2 IMPLANT
BLADE SAW SGTL 83.5X18.5 (BLADE) ×2 IMPLANT
COVER SURGICAL LIGHT HANDLE (MISCELLANEOUS) ×2 IMPLANT
CUP SUT UNIV REVERS 36 NEUTRAL (Cup) ×2 IMPLANT
DERMABOND ADVANCED (GAUZE/BANDAGES/DRESSINGS) ×1
DERMABOND ADVANCED .7 DNX12 (GAUZE/BANDAGES/DRESSINGS) ×1 IMPLANT
DRAPE ORTHO SPLIT 77X108 STRL (DRAPES) ×2
DRAPE SURG 17X11 SM STRL (DRAPES) ×2 IMPLANT
DRAPE SURG ORHT 6 SPLT 77X108 (DRAPES) ×2 IMPLANT
DRAPE U-SHAPE 47X51 STRL (DRAPES) ×2 IMPLANT
DRSG AQUACEL AG ADV 3.5X10 (GAUZE/BANDAGES/DRESSINGS) ×2 IMPLANT
DURAPREP 26ML APPLICATOR (WOUND CARE) ×2 IMPLANT
ELECT BLADE 4.0 EZ CLEAN MEGAD (MISCELLANEOUS) ×2
ELECT CAUTERY BLADE 6.4 (BLADE) ×2 IMPLANT
ELECT REM PT RETURN 9FT ADLT (ELECTROSURGICAL) ×2
ELECTRODE BLDE 4.0 EZ CLN MEGD (MISCELLANEOUS) ×1 IMPLANT
ELECTRODE REM PT RTRN 9FT ADLT (ELECTROSURGICAL) ×1 IMPLANT
FACESHIELD WRAPAROUND (MASK) ×6 IMPLANT
GLENOSPHERE LATERAL 36MM+4 (Shoulder) ×2 IMPLANT
GLOVE BIO SURGEON STRL SZ7.5 (GLOVE) ×2 IMPLANT
GLOVE BIO SURGEON STRL SZ8 (GLOVE) ×2 IMPLANT
GLOVE EUDERMIC 7 POWDERFREE (GLOVE) ×2 IMPLANT
GLOVE SS BIOGEL STRL SZ 7.5 (GLOVE) ×1 IMPLANT
GLOVE SUPERSENSE BIOGEL SZ 7.5 (GLOVE) ×1
GOWN STRL REUS W/ TWL LRG LVL3 (GOWN DISPOSABLE) ×1 IMPLANT
GOWN STRL REUS W/ TWL XL LVL3 (GOWN DISPOSABLE) ×2 IMPLANT
GOWN STRL REUS W/TWL LRG LVL3 (GOWN DISPOSABLE) ×1
GOWN STRL REUS W/TWL XL LVL3 (GOWN DISPOSABLE) ×2
KIT BASIN OR (CUSTOM PROCEDURE TRAY) ×2 IMPLANT
KIT TURNOVER KIT B (KITS) ×2 IMPLANT
LINER HUMERAL 36 +3MM SM (Shoulder) ×2 IMPLANT
MANIFOLD NEPTUNE II (INSTRUMENTS) ×2 IMPLANT
NEEDLE TAPERED W/ NITINOL LOOP (MISCELLANEOUS) ×2 IMPLANT
NS IRRIG 1000ML POUR BTL (IV SOLUTION) ×2 IMPLANT
PACK SHOULDER (CUSTOM PROCEDURE TRAY) ×2 IMPLANT
PAD ARMBOARD 7.5X6 YLW CONV (MISCELLANEOUS) ×4 IMPLANT
RESTRAINT HEAD UNIVERSAL NS (MISCELLANEOUS) ×2 IMPLANT
SCREW CENTRAL NONLOCK 25MM (Screw) ×2 IMPLANT
SCREW LOCK GLENOID UNI PERI (Screw) ×2 IMPLANT
SCREW LOCK PERIPHERAL 30MM (Shoulder) ×2 IMPLANT
SET PIN UNIVERSAL REVERSE (SET/KITS/TRAYS/PACK) ×2 IMPLANT
SLING ARM FOAM STRAP LRG (SOFTGOODS) ×2 IMPLANT
SPONGE LAP 18X18 X RAY DECT (DISPOSABLE) ×2 IMPLANT
STEM HUMERAL UNI REVERS SZ6 (Stem) ×2 IMPLANT
SUT MNCRL AB 3-0 PS2 18 (SUTURE) ×2 IMPLANT
SUT MON AB 2-0 CT1 27 (SUTURE) ×2 IMPLANT
SUT VIC AB 1 CT1 27 (SUTURE) ×1
SUT VIC AB 1 CT1 27XBRD ANBCTR (SUTURE) ×1 IMPLANT
SUTURE TAPE 1.3 40 TPR END (SUTURE) ×1 IMPLANT
SUTURETAPE 1.3 40 TPR END (SUTURE) ×2
TOWEL GREEN STERILE (TOWEL DISPOSABLE) ×2 IMPLANT

## 2018-02-08 NOTE — Anesthesia Procedure Notes (Addendum)
Anesthesia Regional Block: Interscalene brachial plexus block   Pre-Anesthetic Checklist: ,, timeout performed, Correct Patient, Correct Site, Correct Laterality, Correct Procedure, Correct Position, site marked, Risks and benefits discussed,  Surgical consent,  Pre-op evaluation,  At surgeon's request and post-op pain management  Laterality: Right  Prep: chloraprep       Needles:  Injection technique: Single-shot  Needle Type: Echogenic Stimulator Needle     Needle Length: 9cm  Needle Gauge: 21     Additional Needles:   Procedures:,,,, ultrasound used (permanent image in chart),,,,  Narrative:  Start time: 02/08/2018 9:30 AM End time: 02/08/2018 9:40 AM Injection made incrementally with aspirations every 5 mL.  Performed by: Personally  Anesthesiologist: Murvin Natal, MD  Additional Notes: Functioning IV was confirmed and monitors were applied.  A timeout was performed. Sterile prep, hand hygiene and sterile gloves were used. A 63mm 21ga Arrow echogenic stimulator needle was used. Negative aspiration and negative test dose prior to incremental administration of local anesthetic. The patient tolerated the procedure well.  Ultrasound guidance: relevent anatomy identified, needle position confirmed, local anesthetic spread visualized around nerve(s), vascular puncture avoided.  Image printed for medical record.

## 2018-02-08 NOTE — Anesthesia Procedure Notes (Signed)
Procedure Name: Intubation Date/Time: 02/08/2018 10:59 AM Performed by: Genelle Bal, CRNA Pre-anesthesia Checklist: Patient identified, Emergency Drugs available, Suction available and Patient being monitored Patient Re-evaluated:Patient Re-evaluated prior to induction Oxygen Delivery Method: Circle system utilized Preoxygenation: Pre-oxygenation with 100% oxygen Induction Type: IV induction Ventilation: Mask ventilation without difficulty Laryngoscope Size: Miller and 2 Grade View: Grade I Tube type: Oral Tube size: 7.0 mm Number of attempts: 1 Airway Equipment and Method: Stylet Placement Confirmation: ETT inserted through vocal cords under direct vision,  positive ETCO2 and breath sounds checked- equal and bilateral Secured at: 21 cm Tube secured with: Tape Dental Injury: Teeth and Oropharynx as per pre-operative assessment

## 2018-02-08 NOTE — Transfer of Care (Signed)
Immediate Anesthesia Transfer of Care Note  Patient: Cheryl Mcpherson  Procedure(s) Performed: REVERSE RIGHT SHOULDER ARTHROPLASTY (Right Shoulder)  Patient Location: PACU  Anesthesia Type:GA combined with regional for post-op pain  Level of Consciousness: awake, alert  and oriented  Airway & Oxygen Therapy: Patient Spontanous Breathing and Patient connected to face mask oxygen  Post-op Assessment: Report given to RN and Post -op Vital signs reviewed and stable  Post vital signs: Reviewed and stable  Last Vitals:  Vitals Value Taken Time  BP    Temp    Pulse 70 02/08/2018  1:04 PM  Resp 19 02/08/2018  1:04 PM  SpO2 98 % 02/08/2018  1:04 PM  Vitals shown include unvalidated device data.  Last Pain:  Vitals:   02/08/18 0833  TempSrc: Oral  PainSc: 3       Patients Stated Pain Goal: 3 (09/62/83 6629)  Complications: No apparent anesthesia complications

## 2018-02-08 NOTE — Anesthesia Preprocedure Evaluation (Signed)
Anesthesia Evaluation  Patient identified by MRN, date of birth, ID band Patient awake    Reviewed: Allergy & Precautions, NPO status , Patient's Chart, lab work & pertinent test results  Airway Mallampati: III  TM Distance: >3 FB Neck ROM: Full    Dental no notable dental hx.    Pulmonary former smoker,    Pulmonary exam normal breath sounds clear to auscultation       Cardiovascular negative cardio ROS Normal cardiovascular exam Rhythm:Regular Rate:Normal  Pre-op eval per primary care  ECG: SR, rate 69   Neuro/Psych negative neurological ROS  negative psych ROS   GI/Hepatic negative GI ROS, Neg liver ROS,   Endo/Other  negative endocrine ROS  Renal/GU negative Renal ROS     Musculoskeletal  (+) Arthritis ,   Abdominal (+) + obese,   Peds  Hematology HLD   Anesthesia Other Findings Right shoulder rotator cuff tear arthropathy  Reproductive/Obstetrics                            Anesthesia Physical Anesthesia Plan  ASA: II  Anesthesia Plan: General and Regional   Post-op Pain Management: GA combined w/ Regional for post-op pain   Induction: Intravenous  PONV Risk Score and Plan: 3 and Midazolam, Dexamethasone, Ondansetron and Treatment may vary due to age or medical condition  Airway Management Planned: Oral ETT  Additional Equipment:   Intra-op Plan:   Post-operative Plan: Extubation in OR  Informed Consent: I have reviewed the patients History and Physical, chart, labs and discussed the procedure including the risks, benefits and alternatives for the proposed anesthesia with the patient or authorized representative who has indicated his/her understanding and acceptance.   Dental advisory given  Plan Discussed with: CRNA  Anesthesia Plan Comments:         Anesthesia Quick Evaluation

## 2018-02-08 NOTE — H&P (Signed)
[facility]    Chief Complaint: Right shoulder rotator cuff tear arthropathy HPI: The patient is a 67 y.o. female with end stage right shoulder rotator cuff tear arthropathy  Past Medical History:  Diagnosis Date  . Arthritis     Past Surgical History:  Procedure Laterality Date  . BREAST BIOPSY Left   . BUNIONECTOMY Bilateral Rt foot 11/2005; left foot 06/2006  . TONSILLECTOMY      History reviewed. No pertinent family history.  Social History:  reports that she quit smoking about 10 years ago. Her smoking use included cigarettes. She has a 20.00 pack-year smoking history. She has never used smokeless tobacco. She reports that she drinks alcohol. She reports that she does not use drugs.   Medications Prior to Admission  Medication Sig Dispense Refill  . aspirin EC 81 MG tablet Take 81 mg by mouth daily.    . Cholecalciferol (VITAMIN D3 GUMMIES ADULT PO) Take 2 tablets by mouth every evening. Total dose=2000 units    . Multiple Vitamins-Minerals (ADULT GUMMY PO) Take 2 tablets by mouth daily.    . Omega-3 Fatty Acids (OMEGA 3 PO) Take 1,040 mg by mouth daily.    . Probiotic Product (RESTORA) CAPS Take 1 capsule by mouth daily.  2  . simvastatin (ZOCOR) 20 MG tablet Take 20 mg by mouth every evening.       Physical Exam: right shoulder with painful and restricted motion as noted at recent office visits  Vitals  Temp:  [98.5 F (36.9 C)] 98.5 F (36.9 C) (09/12 0833) Pulse Rate:  [69-71] 71 (09/12 0948) Resp:  [14-20] 14 (09/12 0948) BP: (132-159)/(64-81) 132/69 (09/12 0948) SpO2:  [92 %-95 %] 92 % (09/12 0948) Weight:  [98.9 kg] 98.9 kg (09/12 0833)  Assessment/Plan  Impression: Right shoulder rotator cuff tear arthropathy  Plan of Action: Procedure(s): REVERSE RIGHT SHOULDER ARTHROPLASTY  Anupama Piehl M Jeremiah Tarpley 02/08/2018, 10:13 AM Contact # [phone]

## 2018-02-08 NOTE — Plan of Care (Signed)

## 2018-02-08 NOTE — Op Note (Signed)
02/08/2018  12:40 PM  PATIENT:   Cheryl Mcpherson  67 y.o. female  PRE-OPERATIVE DIAGNOSIS:  Right shoulder rotator cuff tear arthropathy  POST-OPERATIVE DIAGNOSIS: Same  PROCEDURE: Right reverse shoulder arthroplasty utilizing a press fit size 6 Arthrex stem with a +3 polyethylene insert, 36/+4 glenosphere on a small baseplate  SURGEON:  Avid Guillette, Metta Clines M.D.  ASSISTANTS: Jenetta Loges, PA-C  ANESTHESIA:   General endotracheal as well as an interscalene block with Exparel  EBL: 150 cc  SPECIMEN: None  Drains: None   PATIENT DISPOSITION:  PACU - hemodynamically stable.    PLAN OF CARE: Admit for overnight observation  Brief history:  [name] has experienced progressively increasing pain and functional limitations related to end-stage right shoulder rotator cuff tear arthropathy.  She is brought to the operating this time for planned right shoulder reverse arthroplasty  Preoperatively and counseled [name] regarding treatment options and potential risks versus benefits thereof.  Possible surgical complications were reviewed including the potential for bleeding, infection, neurovascular injury, persistent pain, anesthetic complication, and possible need for additional surgery.  She understands and accepts and agrees with her planned procedure  Procedure in detail:  After undergoing routine preop evaluation patient received prophylactic antibiotics and interscalene block was established in the holding area by the anesthesia department.  Placed supine on the operative table underwent smooth induction of a general endotracheal anesthesia.  Placed in the beachchair position and appropriately padded protected.  The right shoulder girdle region was sterilely prepped and draped in standard fashion.  Timeout was called.  A deltopectoral approach to the right shoulder was made through an approximate 10 cm incision.  Skin flaps were elevated dissection did carried deeply and  electrocautery was used for hemostasis.  The deltopectoral interval was then developed from proximal to distal with the cephalic vein taken laterally in the upper centimeter of the pectoralis major was tenotomized for exposure.  Conjoined tendon retracted medially.  The biceps tendon was then tenotomized and unroofed and resected proximally.  Subscapularis was then divided from the lesser tuberosity using a "peel" technique and reflected medially.  Capsular attachments were then divided the anterior inferior and inferior aspect of the humeral neck allowing deliver the humeral head through the wound.  We outlined our proposed humeral head resection which completed with an oscillating saw.  Metal cap was placed over the cut proximal humeral surface and attention was then directed to the glenoid which was exposed using standard retractors and a circumferential labral resection was then completed gaining complete visualization of the periphery of the glenoid.  A metal guidepin was then directed into the Mcpherson of the glenoid with a slight inferior tilt to 10 degrees and this was then reamed to a stable subchondral bone bed with the peripheral reamers used to complete preparation of the glenoid.  The size small baseplate was then impacted with a central lag screw placed followed by the inferior and superior locking screws all of which obtained excellent bony purchase and fixation.  A 36/+4 glenosphere was then impacted onto the baseplate with stability confirmed.  We then returned to attention back to the proximal humerus where this was then prepared hand reaming to size 6 broaching up to size 6 which gave excellent fit fixation.  The proximal metaphyseal region was prepared with a central reaming guide.  A a trial was then placed and a trial reduction was performed which showed good soft tissue balance.  Trial was removed the final implant was then assembled  on the back table and then impacted into the humeral canal  with excellent fit and fixation.  This did note the native retroversion of approximate 30 degrees.  Trial reduction was then performed in a +3 poly-showed excellent soft tissue balance.  The trial was removed the final polyethylene was then inserted into our stem final reduction was performed in overall shoulder motion and stability was excellent much to our satisfaction.  The joint was then copiously irrigated hemostasis was obtained.  Subscapularis was then repaired back to the humeral metaphysis utilizing the eyelets on the collar of our implant.  The deltopectoral interval was then reapproximated with a series of figure-of-eight number Vicryl sutures.  2-0 Monocryl used for the subcu layer intracuticular through Monocryl for the skin followed by Dermabond and Aquasol dressing right arm was placed in sling the patient was awakened extubated and taken to the recovery room in stable condition.  Jenetta Loges, PA-C was used as an Environmental consultant throughout this case essential for help with positioning the patient, position extremity, tissue ablation, implantation of prosthesis, wound closure, and intraoperative decision-making.  Marin Shutter MD   Contact # [phone]

## 2018-02-09 ENCOUNTER — Encounter (HOSPITAL_COMMUNITY): Payer: Self-pay | Admitting: Orthopedic Surgery

## 2018-02-09 MED ORDER — OXYCODONE-ACETAMINOPHEN 5-325 MG PO TABS: 1.0000 | ORAL_TABLET | ORAL | 0 refills | Status: DC | PRN

## 2018-02-09 MED ORDER — METHOCARBAMOL 500 MG PO TABS: 500.0000 mg | ORAL_TABLET | Freq: Three times a day (TID) | ORAL | 1 refills | Status: DC | PRN

## 2018-02-09 MED ORDER — ONDANSETRON HCL 4 MG PO TABS: 4.0000 mg | ORAL_TABLET | Freq: Three times a day (TID) | ORAL | 0 refills | Status: DC | PRN

## 2018-02-09 NOTE — Discharge Summary (Signed)
PATIENT ID:      Cheryl Mcpherson  MRN:     [id number] DOB/AGE:    69-02-1951 / 69 y.o.     DISCHARGE SUMMARY  ADMISSION DATE:    02/08/2018 DISCHARGE DATE:    ADMISSION DIAGNOSIS: Right shoulder rotator cuff tear arthropathy Past Medical History:  Diagnosis Date  . Arthritis     DISCHARGE DIAGNOSIS:   Active Problems:   S/p reverse total shoulder arthroplasty   PROCEDURE: Procedure(s): REVERSE RIGHT SHOULDER ARTHROPLASTY on 02/08/2018  CONSULTS:    HISTORY:  See H&P in chart.  HOSPITAL COURSE:  Cheryl Mcpherson is a 67 y.o. admitted on 02/08/2018 with a diagnosis of Right shoulder rotator cuff tear arthropathy.  They were brought to the operating room on 02/08/2018 and underwent Procedure(s): REVERSE RIGHT SHOULDER ARTHROPLASTY.    They were given perioperative antibiotics:  Anti-infectives (From admission, onward)   Start     Dose/Rate Route Frequency Ordered Stop   02/08/18 0845  ceFAZolin (ANCEF) IVPB 2g/100 mL premix     2 g 200 mL/hr over 30 Minutes Intravenous To ShortStay Surgical 02/07/18 0920 02/08/18 1140    .  Patient underwent the above named procedure and tolerated it well. The following day they were hemodynamically stable and pain was controlled on oral analgesics. They were neurovascularly intact to the operative extremity. OT was ordered and worked with patient per protocol. They were medically and orthopaedically stable for discharge on day 1 .    DIAGNOSTIC STUDIES:  RECENT RADIOGRAPHIC STUDIES :  Mm Diag Breast Tomo Uni Left  Result Date: 02/06/2018 CLINICAL DATA:  Patient returns today to evaluate a possible LEFT breast asymmetry questioned on recent screening mammogram. EXAM: DIGITAL DIAGNOSTIC UNILATERAL LEFT MAMMOGRAM WITH CAD AND TOMO COMPARISON:  Previous exams including recent screening mammogram dated 02/01/2018. ACR Breast Density Category b: There are scattered areas of fibroglandular density. FINDINGS: On today's additional diagnostic views,  including spot compression with 3D tomosynthesis, there is no persistent asymmetry indicating superimposition of normal fibroglandular tissues. There are no dominant masses, suspicious calcifications or secondary signs of malignancy identified on today's exam. Mammographic images were processed with CAD. IMPRESSION: No evidence of malignancy. Patient may return to routine annual bilateral screening mammogram schedule. RECOMMENDATION: Screening mammogram in one year.(Code:SM-B-01Y) I have discussed the findings and recommendations with the patient. Results were also provided in writing at the conclusion of the visit. If applicable, a reminder letter will be sent to the patient regarding the next appointment. BI-RADS CATEGORY  1: Negative. Electronically Signed   By: Franki Cabot M.D.   On: 02/06/2018 13:34   Mm 3d Screen Breast Bilateral  Result Date: 02/02/2018 CLINICAL DATA:  Screening. EXAM: DIGITAL SCREENING BILATERAL MAMMOGRAM WITH TOMO AND CAD COMPARISON:  Previous exam(s). ACR Breast Density Category b: There are scattered areas of fibroglandular density. FINDINGS: In the left breast, a possible asymmetry warrants further evaluation. In the right breast, no findings suspicious for malignancy. Images were processed with CAD. IMPRESSION: Further evaluation is suggested for possible asymmetry in the left breast. RECOMMENDATION: Diagnostic mammogram and possibly ultrasound of the left breast. (Code:[id number]) The patient will be contacted regarding the findings, and additional imaging will be scheduled. BI-RADS CATEGORY  0: Incomplete. Need additional imaging evaluation and/or prior mammograms for comparison. Electronically Signed   By: Dorise Bullion III M.D   On: 02/02/2018 11:58    RECENT VITAL SIGNS:   Patient Vitals for the past 24 hrs:  BP Temp Temp src Pulse Resp SpO2  02/09/18 0519 128/78 98.2 F (36.8 C) Oral 64 18 95 %  02/09/18 0048 (!) 123/50 99.1 F (37.3 C) Oral 77 16 93 %  02/08/18  2010 127/87 98.7 F (37.1 C) Oral (!) 58 18 96 %  02/08/18 1600 (!) 121/50 - - (!) 44 - 92 %  02/08/18 1409 (!) 141/71 97.6 F (36.4 C) Oral 63 16 -  02/08/18 1352 134/78 - - 71 16 96 %  02/08/18 1348 - (!) 97 F (36.1 C) - 70 16 96 %  02/08/18 1345 - - - 72 18 99 %  02/08/18 1338 134/66 - - - - -  02/08/18 1329 - - - 69 14 98 %  02/08/18 1323 131/71 - - - - -  02/08/18 1315 - (!) 97 F (36.1 C) - 69 13 95 %  02/08/18 1311 120/67 - - - - -  02/08/18 0948 132/69 - - 71 14 92 %  02/08/18 0937 (!) 149/64 - - 69 17 94 %  .  RECENT EKG RESULTS:   No orders found for this or any previous visit.  DISCHARGE INSTRUCTIONS:  Discharge Instructions    Discharge patient   Complete by:  As directed    Discharge disposition:  01-Home or Self Care   Discharge patient date:  02/09/2018   Discontinue IV   Complete by:  As directed       DISCHARGE MEDICATIONS:   Allergies as of 02/09/2018      Reactions   Sulfa Antibiotics Anaphylaxis, Swelling   FACIAL SWELLING      Medication List    TAKE these medications   ADULT GUMMY PO Take 2 tablets by mouth daily.   aspirin EC 81 MG tablet Take 81 mg by mouth daily.   methocarbamol 500 MG tablet Commonly known as:  ROBAXIN Take 1 tablet (500 mg total) by mouth every 8 (eight) hours as needed for muscle spasms.   OMEGA 3 PO Take 1,040 mg by mouth daily.   ondansetron 4 MG tablet Commonly known as:  ZOFRAN Take 1 tablet (4 mg total) by mouth every 8 (eight) hours as needed for nausea or vomiting.   oxyCODONE-acetaminophen 5-325 MG tablet Commonly known as:  PERCOCET/ROXICET Take 1 tablet by mouth every 4 (four) hours as needed (max 6 q).   RESTORA Caps Take 1 capsule by mouth daily.   simvastatin 20 MG tablet Commonly known as:  ZOCOR Take 20 mg by mouth every evening.   VITAMIN D3 GUMMIES ADULT PO Take 2 tablets by mouth every evening. Total dose=2000 units       FOLLOW UP VISIT:   Follow-up Information    Justice Britain, MD.   Specialty:  Orthopedic Surgery Why:  call to be seen in 10-14 days Contact information: [address] [place] [address] [phone]           DISCHARGE TO: Home   DISCHARGE CONDITION:  Thereasa Parkin Aneesh Faller for Dr. Justice Britain 02/09/2018, 8:40 AM

## 2018-02-09 NOTE — Evaluation (Signed)
Occupational Therapy Evaluation and Discharge Patient Details Name: Cheryl Mcpherson MRN: [id number] DOB: 04-12-1951 Today's Date: 02/09/2018    History of Present Illness The patient is a 67 y.o. female with end stage right shoulder rotator cuff tear arthropathy. Pt is s/p Right reverse shoulder arthroplasty. Pt  has a past medical history of Arthritis.   Clinical Impression   PTA Pt independent in ADL and mobility. Focus of session was education for shoulder exercises, ADL compensatory strategies and sling. Shoulder block still in place and so exercises were demonstrated by OT with Pt verbalizing understanding and reviewing on handouts. Sling adjusted for Pt. Education complete. Thank you for the opportunity to serve this patient.    Follow Up Recommendations  Follow surgeon's recommendation for DC plan and follow-up therapies;Supervision - Intermittent    Equipment Recommendations  None recommended by OT    Recommendations for Other Services       Precautions / Restrictions Precautions Precautions: Shoulder Shoulder Interventions: Shoulder sling/immobilizer;At all times;Off for dressing/bathing/exercises Precaution Booklet Issued: Yes (comment) Precaution Comments: reviewed handout in full Required Braces or Orthoses: Sling Restrictions Weight Bearing Restrictions: Yes RUE Weight Bearing: Non weight bearing      Mobility Bed Mobility Overal bed mobility: Modified Independent             General bed mobility comments: increased time, no assist needed  Transfers Overall transfer level: Modified independent               General transfer comment: supervision for safety - no assist needed    Balance Overall balance assessment: Mild deficits observed, not formally tested                                         ADL either performed or assessed with clinical judgement   ADL                                         General  ADL Comments: Please see shoulder section below     Vision         Perception     Praxis      Pertinent Vitals/Pain Pain Assessment: No/denies pain(block still in place)     Hand Dominance Right   Extremity/Trunk Assessment Upper Extremity Assessment Upper Extremity Assessment: RUE deficits/detail RUE Deficits / Details: deficits as expected post-op RUE Sensation: decreased light touch(block still in place)   Lower Extremity Assessment Lower Extremity Assessment: Overall WFL for tasks assessed       Communication Communication Communication: No difficulties   Cognition Arousal/Alertness: Awake/alert Behavior During Therapy: WFL for tasks assessed/performed Overall Cognitive Status: Within Functional Limits for tasks assessed                                     General Comments       Exercises Exercises: Shoulder Shoulder Exercises Pendulum Exercise: Right;10 reps;Seated;Standing Shoulder Flexion: PROM;AROM;AAROM;Right;10 reps;Supine;Seated(educated to 60 degrees) Shoulder ABduction: PROM;AROM;AAROM;Right;10 reps;Seated(educated to 45 degrees) Shoulder External Rotation: PROM;AROM;AAROM;Right;10 reps;Seated(educated to 20 degrees) Elbow Flexion: AROM;AAROM;Right;10 reps;Seated Elbow Extension: AAROM;AROM;Right;10 reps;Seated Wrist Flexion: AROM;Right Wrist Extension: AROM;Right Digit Composite Flexion: AROM;Right Composite Extension: AROM;Right Neck Flexion: AROM Neck Extension: AROM Neck Lateral Flexion - Right: AROM Neck  Lateral Flexion - Left: AROM   Shoulder Instructions Shoulder Instructions Donning/doffing shirt without moving shoulder: Maximal assistance;Patient able to independently direct caregiver Method for sponge bathing under operated UE: Supervision/safety Donning/doffing sling/immobilizer: Maximal assistance;Patient able to independently direct caregiver Correct positioning of sling/immobilizer: Maximal assistance;Patient able  to independently direct caregiver Pendulum exercises (written home exercise program): Supervision/safety ROM for elbow, wrist and digits of operated UE: Modified independent Sling wearing schedule (on at all times/off for ADL's): Independent Proper positioning of operated UE when showering: Supervision/safety Positioning of UE while sleeping: Maximal assistance;Patient able to independently direct caregiver    Home Living Family/patient expects to be discharged to:: Private residence Living Arrangements: Spouse/significant other;Children Available Help at Discharge: Family;Available 24 hours/day                                    Prior Functioning/Environment Level of Independence: Independent                 OT Problem List: Impaired UE functional use;Decreased range of motion;Decreased knowledge of precautions;Impaired sensation;Obesity;Pain      OT Treatment/Interventions:      OT Goals(Current goals can be found in the care plan section) Acute Rehab OT Goals Patient Stated Goal: to get home today OT Goal Formulation: With patient Time For Goal Achievement: 02/23/18 Potential to Achieve Goals: Good  OT Frequency:     Barriers to D/C:            Co-evaluation              AM-PAC PT "6 Clicks" Daily Activity     Outcome Measure Help from another person eating meals?: A Little Help from another person taking care of personal grooming?: A Little Help from another person toileting, which includes using toliet, bedpan, or urinal?: A Lot Help from another person bathing (including washing, rinsing, drying)?: A Little Help from another person to put on and taking off regular upper body clothing?: A Lot Help from another person to put on and taking off regular lower body clothing?: A Lot 6 Click Score: 15   End of Session Equipment Utilized During Treatment: Other (comment)(sling) Nurse Communication: Mobility status  Activity Tolerance: Patient  tolerated treatment well Patient left: in bed;with call bell/phone within reach;with family/visitor present;with SCD's reapplied  OT Visit Diagnosis: Pain Pain - Right/Left: Right Pain - part of body: Shoulder                Time: 7893-8101 OT Time Calculation (min): 48 min Charges:  OT General Charges $OT Visit: 1 Visit OT Evaluation $OT Eval Moderate Complexity: 1 Mod OT Treatments $Self Care/Home Management : 8-22 mins $Therapeutic Exercise: 8-22 mins  Hulda Humphrey OTR/L Acute Rehabilitation Services Pager: [phone] Office: [phone]  Merri Ray Suzzanne Brunkhorst 02/09/2018, 10:12 AM

## 2018-02-09 NOTE — Anesthesia Postprocedure Evaluation (Signed)
Anesthesia Post Note  Patient: Cheryl Mcpherson  Procedure(s) Performed: REVERSE RIGHT SHOULDER ARTHROPLASTY (Right Shoulder)     Patient location during evaluation: PACU Anesthesia Type: Regional and General Level of consciousness: awake and alert Pain management: pain level controlled Vital Signs Assessment: post-procedure vital signs reviewed and stable Respiratory status: spontaneous breathing, nonlabored ventilation, respiratory function stable and patient connected to nasal cannula oxygen Cardiovascular status: blood pressure returned to baseline and stable Postop Assessment: no apparent nausea or vomiting Anesthetic complications: no    Last Vitals:  Vitals:   02/09/18 0048 02/09/18 0519  BP: (!) 123/50 128/78  Pulse: 77 64  Resp: 16 18  Temp: 37.3 C 36.8 C  SpO2: 93% 95%    Last Pain:  Vitals:   02/09/18 0519  TempSrc: Oral  PainSc:                  Karyl Kinnier Kiamesha Samet

## 2018-02-09 NOTE — Progress Notes (Signed)
Cheryl Mcpherson  MRN: [id number] DOB/Age: 1951/02/11 67 y.o. [facility] Procedure: Procedure(s) (LRB): REVERSE RIGHT SHOULDER ARTHROPLASTY (Right)     Subjective: Rough night sleeping, pain was minimal but finally took sleeping pill with no help and an oxycodone got her 2 hours. Not much pain   Vital Signs Temp:  [97 F (36.1 C)-99.1 F (37.3 C)] 98.2 F (36.8 C) (09/13 0519) Pulse Rate:  [44-77] 64 (09/13 0519) Resp:  [13-18] 18 (09/13 0519) BP: (120-149)/(50-87) 128/78 (09/13 0519) SpO2:  [92 %-99 %] 95 % (09/13 0519)  Lab Results No results for input(s): WBC, HGB, HCT, PLT in the last 72 hours. BMET No results for input(s): NA, K, CL, CO2, GLUCOSE, BUN, CREATININE, CALCIUM in the last 72 hours. No results found for: INR   Exam Moving fingers but block still in effect        Plan Dc after OT  Jenetta Loges PA-C  02/09/2018, 8:36 AM Contact # [phone]

## 2018-02-09 NOTE — Discharge Instructions (Signed)
Metta Clines. Supple, M.D., F.A.A.O.S. Orthopaedic Surgery Specializing in Arthroscopic and Reconstructive Surgery of the Shoulder and Knee [phone] [address] [place], [place] [address] - Fax [phone]   POST-OP TOTAL SHOULDER REPLACEMENT INSTRUCTIONS  1. Call the office at [phone] to schedule your first post-op appointment 10-14 days from the date of your surgery.  2. The bandage over your incision is waterproof. You may begin showering with this dressing on. You may leave this dressing on until first follow up appointment within 2 weeks. We prefer you leave this dressing in place until follow up however after 5-7 days if you are having itching or skin irritation and would like to remove it you may do so. Go slow and tug at the borders gently to break the bond the dressing has with the skin. At this point if there is no drainage it is okay to go without a bandage or you may cover it with a light guaze and tape. You can also expect significant bruising around your shoulder that will drift down your arm and into your chest wall. This is very normal and should resolve over several days.   3. Wear your sling/immobilizer at all times except to perform the exercises below or to occasionally let your arm dangle by your side to stretch your elbow. You also need to sleep in your sling immobilizer until instructed otherwise.  4. Range of motion to your elbow, wrist, and hand are encouraged 3-5 times daily. Exercise to your hand and fingers helps to reduce swelling you may experience.  5. Utilize ice to the shoulder 3-5 times minimum a day and additionally if you are experiencing pain.  6. Prescriptions for a pain medication and a muscle relaxant are provided for you. It is recommended that if you are experiencing pain that you pain medication alone is not controlling, add the muscle relaxant along with the pain medication which can give additional pain relief. The first 1-2 days  is generally the most severe of your pain and then should gradually decrease. As your pain lessens it is recommended that you decrease your use of the pain medications to an "as needed basis'" only and to always comply with the recommended dosages of the pain medications.  7. Pain medications can produce constipation along with their use. If you experience this, the use of an over the counter stool softener or laxative daily is recommended.   8. For additional questions or concerns, please do not hesitate to call the office. If after hours there is an answering service to forward your concerns to the physician on call.  9.Pain control following an exparel block  To help control your post-operative pain you received a nerve block  performed with Exparel which is a long acting anesthetic (numbing agent) which can provide pain relief and sensations of numbness (and relief of pain) in the operative shoulder and arm for up to 3 days. Sometimes it provides mixed relief, meaning you may still have numbness in certain areas of the arm but can still be able to move  parts of that arm, hand, and fingers. We recommend that your prescribed pain medications  be used as needed. We do not feel it is necessary to "pre medicate" and "stay ahead" of pain.  Taking narcotic pain medications when you are not having any pain can lead to unnecessary and potentially dangerous side effects.   Take 2 aleve twice a day for 1 week then as needed  POST-OP  EXERCISES  Pendulum Exercises  Perform pendulum exercises while standing and bending at the waist. Support your uninvolved arm on a table or chair and allow your operated arm to hang freely. Make sure to do these exercises passively - not using you shoulder muscles.  Repeat 20 times. Do 3 sessions per day.

## 2018-12-27 ENCOUNTER — Other Ambulatory Visit: Payer: Self-pay | Admitting: Physician Assistant

## 2018-12-27 DIAGNOSIS — Z1231 Encounter for screening mammogram for malignant neoplasm of breast: Secondary | ICD-10-CM

## 2019-02-08 ENCOUNTER — Other Ambulatory Visit: Payer: Self-pay

## 2019-02-08 ENCOUNTER — Ambulatory Visit
Admission: RE | Admit: 2019-02-08 | Discharge: 2019-02-08 | Disposition: A | Discharge status: Home / Self Care | Payer: Medicare Other | Source: Ambulatory Visit | Attending: Physician Assistant | Admitting: Physician Assistant

## 2019-02-08 DIAGNOSIS — Z1231 Encounter for screening mammogram for malignant neoplasm of breast: Secondary | ICD-10-CM

## 2019-05-31 HISTORY — PX: CATARACT EXTRACTION: SUR2

## 2019-08-15 ENCOUNTER — Other Ambulatory Visit: Payer: Self-pay | Admitting: Physician Assistant

## 2019-08-15 DIAGNOSIS — Z13820 Encounter for screening for osteoporosis: Secondary | ICD-10-CM

## 2019-08-15 DIAGNOSIS — E210 Primary hyperparathyroidism: Secondary | ICD-10-CM

## 2019-12-12 ENCOUNTER — Other Ambulatory Visit: Payer: Self-pay | Admitting: Physician Assistant

## 2019-12-12 DIAGNOSIS — Z1231 Encounter for screening mammogram for malignant neoplasm of breast: Secondary | ICD-10-CM

## 2020-03-09 ENCOUNTER — Ambulatory Visit
Admission: RE | Admit: 2020-03-09 | Discharge: 2020-03-09 | Disposition: A | Discharge status: Home / Self Care | Payer: Medicare Other | Source: Ambulatory Visit | Attending: Physician Assistant | Admitting: Physician Assistant

## 2020-03-09 ENCOUNTER — Other Ambulatory Visit: Payer: Self-pay

## 2020-03-09 DIAGNOSIS — Z1231 Encounter for screening mammogram for malignant neoplasm of breast: Secondary | ICD-10-CM

## 2020-03-09 DIAGNOSIS — Z13820 Encounter for screening for osteoporosis: Secondary | ICD-10-CM

## 2020-03-09 DIAGNOSIS — E210 Primary hyperparathyroidism: Secondary | ICD-10-CM

## 2020-08-10 DIAGNOSIS — E559 Vitamin D deficiency, unspecified: Secondary | ICD-10-CM | POA: Diagnosis not present

## 2020-08-10 DIAGNOSIS — E782 Mixed hyperlipidemia: Secondary | ICD-10-CM | POA: Diagnosis not present

## 2020-08-10 DIAGNOSIS — Z131 Encounter for screening for diabetes mellitus: Secondary | ICD-10-CM | POA: Diagnosis not present

## 2020-08-13 DIAGNOSIS — R053 Chronic cough: Secondary | ICD-10-CM | POA: Diagnosis not present

## 2020-08-13 DIAGNOSIS — Z0000 Encounter for general adult medical examination without abnormal findings: Secondary | ICD-10-CM | POA: Diagnosis not present

## 2020-08-13 DIAGNOSIS — E559 Vitamin D deficiency, unspecified: Secondary | ICD-10-CM | POA: Diagnosis not present

## 2020-08-13 DIAGNOSIS — Z131 Encounter for screening for diabetes mellitus: Secondary | ICD-10-CM | POA: Diagnosis not present

## 2020-08-13 DIAGNOSIS — L570 Actinic keratosis: Secondary | ICD-10-CM | POA: Diagnosis not present

## 2020-09-17 DIAGNOSIS — L570 Actinic keratosis: Secondary | ICD-10-CM | POA: Diagnosis not present

## 2020-09-17 DIAGNOSIS — D485 Neoplasm of uncertain behavior of skin: Secondary | ICD-10-CM | POA: Diagnosis not present

## 2020-09-17 DIAGNOSIS — L814 Other melanin hyperpigmentation: Secondary | ICD-10-CM | POA: Diagnosis not present

## 2020-09-17 DIAGNOSIS — L821 Other seborrheic keratosis: Secondary | ICD-10-CM | POA: Diagnosis not present

## 2020-09-17 DIAGNOSIS — L738 Other specified follicular disorders: Secondary | ICD-10-CM | POA: Diagnosis not present

## 2020-09-17 DIAGNOSIS — L820 Inflamed seborrheic keratosis: Secondary | ICD-10-CM | POA: Diagnosis not present

## 2020-09-17 DIAGNOSIS — L918 Other hypertrophic disorders of the skin: Secondary | ICD-10-CM | POA: Diagnosis not present

## 2021-01-27 ENCOUNTER — Other Ambulatory Visit: Payer: Self-pay | Admitting: Diagnostic Radiology

## 2021-01-27 DIAGNOSIS — Z1231 Encounter for screening mammogram for malignant neoplasm of breast: Secondary | ICD-10-CM

## 2021-02-18 DIAGNOSIS — N8111 Cystocele, midline: Secondary | ICD-10-CM | POA: Diagnosis not present

## 2021-02-22 DIAGNOSIS — M549 Dorsalgia, unspecified: Secondary | ICD-10-CM | POA: Diagnosis not present

## 2021-02-22 DIAGNOSIS — M5430 Sciatica, unspecified side: Secondary | ICD-10-CM | POA: Diagnosis not present

## 2021-03-03 ENCOUNTER — Ambulatory Visit: Payer: Medicare Other | Admitting: Podiatry

## 2021-03-04 DIAGNOSIS — R030 Elevated blood-pressure reading, without diagnosis of hypertension: Secondary | ICD-10-CM | POA: Diagnosis not present

## 2021-03-04 DIAGNOSIS — M5416 Radiculopathy, lumbar region: Secondary | ICD-10-CM | POA: Diagnosis not present

## 2021-03-10 ENCOUNTER — Other Ambulatory Visit: Payer: Self-pay

## 2021-03-10 ENCOUNTER — Ambulatory Visit
Admission: RE | Admit: 2021-03-10 | Discharge: 2021-03-10 | Disposition: A | Discharge status: Home / Self Care | Payer: Medicare Other | Source: Ambulatory Visit

## 2021-03-10 DIAGNOSIS — Z1231 Encounter for screening mammogram for malignant neoplasm of breast: Secondary | ICD-10-CM | POA: Diagnosis not present

## 2021-03-11 DIAGNOSIS — R200 Anesthesia of skin: Secondary | ICD-10-CM | POA: Diagnosis not present

## 2021-03-11 DIAGNOSIS — M5416 Radiculopathy, lumbar region: Secondary | ICD-10-CM | POA: Diagnosis not present

## 2021-03-11 DIAGNOSIS — M545 Low back pain, unspecified: Secondary | ICD-10-CM | POA: Diagnosis not present

## 2021-03-11 DIAGNOSIS — M48061 Spinal stenosis, lumbar region without neurogenic claudication: Secondary | ICD-10-CM | POA: Diagnosis not present

## 2021-03-16 ENCOUNTER — Other Ambulatory Visit: Payer: Self-pay | Admitting: Nurse Practitioner

## 2021-03-16 ENCOUNTER — Other Ambulatory Visit: Payer: Self-pay | Admitting: *Deleted

## 2021-03-16 ENCOUNTER — Other Ambulatory Visit: Payer: Self-pay | Admitting: Diagnostic Radiology

## 2021-03-16 DIAGNOSIS — R928 Other abnormal and inconclusive findings on diagnostic imaging of breast: Secondary | ICD-10-CM

## 2021-03-22 DIAGNOSIS — M5416 Radiculopathy, lumbar region: Secondary | ICD-10-CM | POA: Diagnosis not present

## 2021-03-24 ENCOUNTER — Other Ambulatory Visit: Payer: Self-pay

## 2021-03-24 ENCOUNTER — Ambulatory Visit
Admission: RE | Admit: 2021-03-24 | Discharge: 2021-03-24 | Disposition: A | Discharge status: Home / Self Care | Payer: Medicare Other | Source: Ambulatory Visit | Attending: Nurse Practitioner | Admitting: Nurse Practitioner

## 2021-03-24 ENCOUNTER — Other Ambulatory Visit: Payer: Self-pay | Admitting: Nurse Practitioner

## 2021-03-24 DIAGNOSIS — N63 Unspecified lump in unspecified breast: Secondary | ICD-10-CM

## 2021-03-24 DIAGNOSIS — R928 Other abnormal and inconclusive findings on diagnostic imaging of breast: Secondary | ICD-10-CM

## 2021-03-24 DIAGNOSIS — N6489 Other specified disorders of breast: Secondary | ICD-10-CM

## 2021-03-24 DIAGNOSIS — R922 Inconclusive mammogram: Secondary | ICD-10-CM | POA: Diagnosis not present

## 2021-03-25 ENCOUNTER — Other Ambulatory Visit: Payer: Self-pay | Admitting: Family Medicine

## 2021-03-25 DIAGNOSIS — N6489 Other specified disorders of breast: Secondary | ICD-10-CM

## 2021-05-05 DIAGNOSIS — H26493 Other secondary cataract, bilateral: Secondary | ICD-10-CM | POA: Diagnosis not present

## 2021-05-05 DIAGNOSIS — H0102B Squamous blepharitis left eye, upper and lower eyelids: Secondary | ICD-10-CM | POA: Diagnosis not present

## 2021-05-05 DIAGNOSIS — H0102A Squamous blepharitis right eye, upper and lower eyelids: Secondary | ICD-10-CM | POA: Diagnosis not present

## 2021-05-05 DIAGNOSIS — H43813 Vitreous degeneration, bilateral: Secondary | ICD-10-CM | POA: Diagnosis not present

## 2021-06-09 DIAGNOSIS — H0102B Squamous blepharitis left eye, upper and lower eyelids: Secondary | ICD-10-CM | POA: Diagnosis not present

## 2021-06-09 DIAGNOSIS — H0102A Squamous blepharitis right eye, upper and lower eyelids: Secondary | ICD-10-CM | POA: Diagnosis not present

## 2021-06-09 DIAGNOSIS — H16223 Keratoconjunctivitis sicca, not specified as Sjogren's, bilateral: Secondary | ICD-10-CM | POA: Diagnosis not present

## 2021-09-15 DIAGNOSIS — H0102A Squamous blepharitis right eye, upper and lower eyelids: Secondary | ICD-10-CM | POA: Diagnosis not present

## 2021-09-15 DIAGNOSIS — H0102B Squamous blepharitis left eye, upper and lower eyelids: Secondary | ICD-10-CM | POA: Diagnosis not present

## 2021-09-15 DIAGNOSIS — H16223 Keratoconjunctivitis sicca, not specified as Sjogren's, bilateral: Secondary | ICD-10-CM | POA: Diagnosis not present

## 2021-09-24 ENCOUNTER — Other Ambulatory Visit: Payer: Medicare Other

## 2021-10-28 ENCOUNTER — Other Ambulatory Visit: Payer: Medicare Other

## 2021-11-10 ENCOUNTER — Ambulatory Visit
Admission: RE | Admit: 2021-11-10 | Discharge: 2021-11-10 | Disposition: A | Discharge status: Home / Self Care | Payer: Medicare Other | Source: Ambulatory Visit | Attending: Family Medicine | Admitting: Family Medicine

## 2021-11-10 DIAGNOSIS — N6321 Unspecified lump in the left breast, upper outer quadrant: Secondary | ICD-10-CM | POA: Diagnosis not present

## 2021-11-10 DIAGNOSIS — N6489 Other specified disorders of breast: Secondary | ICD-10-CM

## 2021-11-10 DIAGNOSIS — R928 Other abnormal and inconclusive findings on diagnostic imaging of breast: Secondary | ICD-10-CM | POA: Diagnosis not present

## 2021-11-10 DIAGNOSIS — N6323 Unspecified lump in the left breast, lower outer quadrant: Secondary | ICD-10-CM | POA: Diagnosis not present

## 2021-11-19 DIAGNOSIS — E782 Mixed hyperlipidemia: Secondary | ICD-10-CM | POA: Diagnosis not present

## 2021-11-19 DIAGNOSIS — Z23 Encounter for immunization: Secondary | ICD-10-CM | POA: Diagnosis not present

## 2021-11-19 DIAGNOSIS — R001 Bradycardia, unspecified: Secondary | ICD-10-CM | POA: Diagnosis not present

## 2021-11-19 DIAGNOSIS — Z0000 Encounter for general adult medical examination without abnormal findings: Secondary | ICD-10-CM | POA: Diagnosis not present

## 2021-11-24 ENCOUNTER — Encounter: Payer: Self-pay | Admitting: Cardiology

## 2021-11-24 ENCOUNTER — Ambulatory Visit: Payer: Medicare Other | Admitting: Cardiology

## 2021-11-24 VITALS — BP 160/89 | HR 42 | Ht 66.0 in | Wt 170.8 lb

## 2021-11-24 DIAGNOSIS — R001 Bradycardia, unspecified: Secondary | ICD-10-CM | POA: Diagnosis not present

## 2021-11-24 NOTE — Patient Instructions (Addendum)
Medication Instructions:   No changes  *If you need a refill on your cardiac medications before your next appointment, please call your pharmacy*  Other Instructions    Monitor  for sign and symptoms - fatigue , exercise intolerance , palpitations, shortness of breath with exertion , dizziness, near syncope( nearly passing out)   Keep monitoring with Fitbit   Lab Work: Not needed    Testing/Procedures:  Not needed  Follow-Up: At [facility], you and your health needs are our priority.  As part of our continuing mission to provide you with exceptional heart care, we have created designated Provider Care Teams.  These Care Teams include your primary Cardiologist (physician) and Advanced Practice Providers (APPs -  Physician Assistants and Nurse Practitioners) who all work together to provide you with the care you need, when you need it.     Your next appointment:   6 month(s)  The format for your next appointment:   In Person  Provider:   Dr Ellyn Hack

## 2021-11-24 NOTE — Progress Notes (Signed)
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  Completely asymptomatic.  She does not seem to have any lightheadedness dizziness.  Does not seem like she has chronotropic incompetence.  Her Fitbit also shows heart rate going up into the 120s.  She denies any irregular heartbeats so I do not think there is much in the way of any arrhythmias.  Because she has a Fitbit that she is able to keep track of her heart rate response, I do not think we need to put her on a monitor.  I simply just think we need to continue to monitor her results on the Fitbit and we discussed concerning symptoms.  I will see her back in 6 months to reassess her symptoms.  As long as she is able to continue to increase her heart rate above 90 with exertion and she is not symptomatic, there is no indication for pacemaker.  I have screened her medications and she is not on anything that I know of that would slow her heart rate down.  I actually told her that if she wants to go back on some caffeine she probably could.      Relevant Orders   EKG 12-Lead (Completed)    ===================================  HPI:    Cheryl Mcpherson is a 71 y.o. female with a PMH notable for basically hyperlipidemia and previous obesity who is being seen today for the evaluation of SINUS BRADYCARDIA at the request of Stamey, Girtha Rm, FNP.  Jaice Lague was seen on November 19, 2021 by Karren Cobble, FNP for routine follow-up.  She noted about a 50 pound weight loss over the past year or so  after cleaning up her diet and getting at least 10,000 12,000 steps a day.  She feels much better since the weight loss.  However when which she was checking her vital signs her heart rates were noted to be in the 30s.  She noted on her Fitbit her heart rates often run in the 40s to 50s over the last month or so.  No symptoms. => Referred for cardiology follow-up  Recent Hospitalizations: None  Reviewed  CV studies:    The following studies were reviewed today: (if available, images/films reviewed: From Epic Chart or Care Everywhere) None:  Interval History:   Cheryl Mcpherson presents here today for cardiology follow-up indicating that she really denies any symptoms whatsoever of dizziness and lightheadedness or wooziness.  No syncope or near syncope.  No TIA or amaurosis fugax.  No headaches or blurred vision.  She has been using an eyedrop but it does not have anything that would slow her heart rate down. She has been pretty diligent over the last year or so with trying at least 10-12,000 steps a day and has made some dietary changes as well.  She basically discovered at the sweets.  She also has cognitive so does not coffee/caffeine drinks.  She says that whenever she exercises she notes that her heart rate will go into the 120s.  She showed me her Fitbit heart rate range from roughly 40 to  228 bpm just today.  When she does that she gets up and walks around the house doing laps in the house to try to get her steps.  She does not actually go out and do routine exercise per se.  She does not go out and do aerobic exercises or pedal bike etc.  She was too scared to go to the gym during the [place] timeframe and just got comfortable with her routine now.  She clearly does not exert herself dramatically and therefore is not sure what would happen if she were to really push herself, she probably will get short of breath.  This would deftly be much less than it would have been before her weight  loss.  Her blood pressure is also usually much better controlled than it is here today.  She is very stressed out because over the last several days she and her husband have a dog sit for their daughter's dogs.  They are a lot more difficult to handle than her own doctors.  CV Review of Symptoms (Summary) Cardiovascular ROS: positive for - - A little anxious today making blood pressure high.  This is unusual for her.  May get exertional dyspnea if she overexerts but not with routine activity. negative for - chest pain, edema, irregular heartbeat, orthopnea, palpitations, paroxysmal nocturnal dyspnea, rapid heart rate, shortness of breath, or Lightheadedness, dizziness or wooziness, syncope/near syncope or TIA/amaurosis fugax, claudication  REVIEWED OF SYSTEMS   Review of Systems  Constitutional:  Negative for fever, malaise/fatigue (No symptoms to suggest fatigue or chronotropic incompetence.) and weight loss (She is lost about 50 pounds in the last year or 2.).  HENT:  Negative for congestion.   Respiratory:  Negative for cough and shortness of breath.   Cardiovascular:  Negative for claudication.  Gastrointestinal: Negative.  Negative for blood in stool and melena.  Genitourinary:  Negative for hematuria.  Musculoskeletal:  Positive for joint pain. Negative for back pain.  Neurological:  Negative for dizziness, focal weakness and weakness.  Psychiatric/Behavioral:  Negative for depression and memory loss. The patient is nervous/anxious and has insomnia.        She is little stressed out today having a dog except for her daughter's 2 dogs.     I have reviewed and (if needed) personally updated the patient's problem list, medications, allergies, past medical and surgical history, social and family history.   PAST MEDICAL HISTORY   Past Medical History:  Diagnosis Date   Arthritis    History of depression    Stable on no meds.   Hyperlipidemia    Takes simvastatin and omega-3 fatty  acids.   Hyperparathyroidism , secondary, non-renal ([place])    Started on Fosamax   Osteopenia     PAST SURGICAL HISTORY   Past Surgical History:  Procedure Laterality Date   BREAST BIOPSY Left 04/2017   BUNIONECTOMY Bilateral Rt foot 11/2005; left foot 06/2006   CATARACT EXTRACTION Bilateral 2021   Right-March; left-April 2021   REVERSE SHOULDER ARTHROPLASTY Right 02/08/2018   Procedure: REVERSE RIGHT SHOULDER ARTHROPLASTY;  Surgeon: Justice Britain, MD;  Location: [place];  Service: Orthopedics;  Laterality: Right;   TONSILLECTOMY      Immunization History  Administered Date(s) Administered   Tdap 01/18/2015    MEDICATIONS/ALLERGIES   Current Meds  Medication Sig   calcium carbonate (SUPER CALCIUM) 1500 (600 Ca) MG TABS tablet 1 tablet with meals   Cholecalciferol (VITAMIN D3 GUMMIES ADULT PO) Take 2 tablets by mouth every  evening. Total dose=2000 units   Multiple Vitamins-Minerals (ADULT GUMMY PO) Take 2 tablets by mouth daily.   Omega-3 Fatty Acids (OMEGA 3 PO) Take 1,040 mg by mouth daily.   Psyllium (FIBER) 28.3 % POWD See admin instructions.   simvastatin (ZOCOR) 20 MG tablet Take 20 mg by mouth every evening.   [DISCONTINUED] simvastatin (ZOCOR) 20 MG tablet Take 1 tablet by mouth every evening.    Allergies  Allergen Reactions   Sulfa Antibiotics Anaphylaxis and Swelling    FACIAL SWELLING    SOCIAL HISTORY/FAMILY HISTORY   Reviewed in Epic:   Social History   Tobacco Use   Smoking status: Former    Packs/day: 1.00    Years: 20.00    Total pack years: 20.00    Types: Cigarettes    Quit date: 12/29/2007    Years since quitting: 13.9   Smokeless tobacco: Never  Vaping Use   Vaping Use: Never used  Substance Use Topics   Alcohol use: Yes    Comment: rarely   Drug use: No   Social History   Social History Narrative   She is currently retired   Former smoker with rare alcohol use.   Has pretty much cut out caffeine.   Restrict yourself to a 1000-1400  -calorie/day diet.   Exercises walking 10,000-12,000 steps daily.   Family History  Problem Relation Age of Onset   Kidney failure Mother    Hypertension Mother    Hypertension Father    Hyperlipidemia Father    Heart attack Father 40       Presumably died of complications   Coronary artery disease Father 1   Healthy Sister    Healthy Sister    Obesity Sister    Non-Hodgkin's lymphoma Daughter        Follicular   Colon cancer Neg Hx    Liver disease Neg Hx    Diabetes Mellitus II Neg Hx     OBJCTIVE -PE, EKG, labs   Wt Readings from Last 3 Encounters:  11/24/21 170 lb 12.8 oz (77.5 kg)  02/08/18 217 lb 15.9 oz (98.9 kg)  02/02/18 218 lb (98.9 kg)    Physical Exam: BP (!) 160/89   Pulse (!) 42   Ht '5\' 6"'$  (1.676 m)   Wt 170 lb 12.8 oz (77.5 kg)   LMP 12/28/1996   SpO2 98%   BMI 27.57 kg/m  Physical Exam Vitals reviewed.  Constitutional:      General: She is not in acute distress.    Appearance: Normal appearance. She is normal weight. She is not ill-appearing or toxic-appearing.     Comments: Well-nourished, well-groomed.  Healthy-appearing.  HENT:     Head: Normocephalic and atraumatic.  Eyes:     Extraocular Movements: Extraocular movements intact.     Pupils: Pupils are equal, round, and reactive to light.  Neck:     Vascular: No carotid bruit.  Cardiovascular:     Rate and Rhythm: Regular rhythm. Bradycardia present.     Pulses: Normal pulses.     Heart sounds: Normal heart sounds. No murmur heard.    No friction rub. No gallop.  Pulmonary:     Effort: Pulmonary effort is normal. No respiratory distress.     Breath sounds: Normal breath sounds. No wheezing, rhonchi or rales.  Abdominal:     General: Abdomen is flat. Bowel sounds are normal. There is no distension.     Palpations: Abdomen is soft. There is no mass.  Tenderness: There is no abdominal tenderness. There is no guarding or rebound.     Comments: No HSM or bruit  Musculoskeletal:         General: No swelling. Normal range of motion.     Cervical back: Normal range of motion and neck supple.  Skin:    General: Skin is warm and dry.  Neurological:     General: No focal deficit present.     Mental Status: She is alert and oriented to person, place, and time.     Cranial Nerves: No cranial nerve deficit.     Gait: Gait normal.  Psychiatric:        Mood and Affect: Mood normal.        Behavior: Behavior normal.        Thought Content: Thought content normal.        Judgment: Judgment normal.     Adult ECG Report  Rate: 42 ;  Rhythm:  Marked sinus bradycardia, borderline voltage.  Otherwise normal axis, intervals and durations. ;   Narrative Interpretation: Similar to PCPs office.  EKG from PCPs office dated 11/19/2021: Marked sinus bradycardia-48 bpm with left atrial enlargement.  1 F AVB.  Low voltage  Recent Labs: 11/19/2021 Na+ 142, K+ 3.6, Cl- 106, HCO3-31, BUN 14, Cr 0.71, Glu 75, Ca2+ 10.2; AST 22, ALT 30, AlkP 91 CBC: W 7.2, H/H 14.0/41.7, Plt 255 TC 193, TG 99, HDL 53, LDLc 122 (07/2020: TC 179, TG 169, HDL 45, LDLc 105; vitamin D 66.5.)   No results found for: "CHOL", "HDL", "LDLCALC", "LDLDIRECT", "TRIG", "CHOLHDL" No results found for: "CREATININE", "BUN", "NA", "K", "CL", "CO2"     No data to display          No results found for: "HGBA1C" No results found for: "TSH"  ==================================================  COVID-19 Education: The signs and symptoms of COVID-19 were discussed with the patient and how to seek care for testing (follow up with PCP or arrange E-visit).    I spent a total of 28 minutes with the patient spent in direct patient consultation.  Additional time spent with chart review  / charting (studies, outside notes, etc): 34 min => Outside clinic note, labs medical history past medical history and social history reviewed.  Chart updated. Total Time: 62 min  Current medicines are reviewed at length with the patient today.   (+/- concerns) N/A  This visit occurred during the SARS-CoV-2 public health emergency.  Safety protocols were in place, including screening questions prior to the visit, additional usage of staff PPE, and extensive cleaning of exam room while observing appropriate contact time as indicated for disinfecting solutions.  Notice: This dictation was prepared with Dragon dictation along with smart phrase technology. Any transcriptional errors that result from this process are unintentional and may not be corrected upon review.   Studies Ordered:  Orders Placed This Encounter  Procedures   EKG 12-Lead   No orders of the defined types were placed in this encounter.   Patient Instructions / Medication Changes & Studies & Tests Ordered   Patient Instructions  Medication Instructions:   No changes  *If you need a refill on your cardiac medications before your next appointment, please call your pharmacy*  Other Instructions    Monitor  for sign and symptoms - fatigue , exercise intolerance , palpitations, shortness of breath with exertion , dizziness, near syncope( nearly passing out)   Keep monitoring with Fitbit   Lab Work: Not needed  Testing/Procedures:  Not needed  Follow-Up: At [facility], you and your health needs are our priority.  As part of our continuing mission to provide you with exceptional heart care, we have created designated Provider Care Teams.  These Care Teams include your primary Cardiologist (physician) and Advanced Practice Providers (APPs -  Physician Assistants and Nurse Practitioners) who all work together to provide you with the care you need, when you need it.     Your next appointment:   6 month(s)  The format for your next appointment:   In Person  Provider:   Dr Sanjuana Kava, M.D., M.S. Interventional Cardiologist   Pager # [phone] Phone # [phone] [address] [place], [place]  [address]   Thank you for choosing Heartcare at [facility]!!

## 2021-11-26 ENCOUNTER — Encounter: Payer: Self-pay | Admitting: Cardiology

## 2021-11-26 DIAGNOSIS — R001 Bradycardia, unspecified: Secondary | ICD-10-CM | POA: Insufficient documentation

## 2021-11-26 NOTE — Assessment & Plan Note (Addendum)
Looks like sinus bradycardia with first-degree AV block on her EKG.  Completely asymptomatic.  She does not seem to have any lightheadedness dizziness.  Does not seem like she has chronotropic incompetence.  Her Fitbit also shows heart rate going up into the 120s.  She denies any irregular heartbeats so I do not think there is much in the way of any arrhythmias.  Because she has a Fitbit that she is able to keep track of her heart rate response, I do not think we need to put her on a monitor.  I simply just think we need to continue to monitor her results on the Fitbit and we discussed concerning symptoms.  I will see her back in 6 months to reassess her symptoms.  As long as she is able to continue to increase her heart rate above 90 with exertion and she is not symptomatic, there is no indication for pacemaker.  I have screened her medications and she is not on anything that I know of that would slow her heart rate down.  I actually told her that if she wants to go back on some caffeine she probably could.

## 2021-11-29 ENCOUNTER — Other Ambulatory Visit: Payer: Self-pay

## 2021-11-29 ENCOUNTER — Encounter (HOSPITAL_BASED_OUTPATIENT_CLINIC_OR_DEPARTMENT_OTHER): Payer: Self-pay

## 2021-11-29 DIAGNOSIS — W010XXA Fall on same level from slipping, tripping and stumbling without subsequent striking against object, initial encounter: Secondary | ICD-10-CM | POA: Diagnosis not present

## 2021-11-29 DIAGNOSIS — S60221A Contusion of right hand, initial encounter: Secondary | ICD-10-CM | POA: Insufficient documentation

## 2021-11-29 DIAGNOSIS — Y9389 Activity, other specified: Secondary | ICD-10-CM | POA: Diagnosis not present

## 2021-11-29 DIAGNOSIS — S0992XA Unspecified injury of nose, initial encounter: Secondary | ICD-10-CM | POA: Diagnosis present

## 2021-11-29 DIAGNOSIS — S0121XA Laceration without foreign body of nose, initial encounter: Secondary | ICD-10-CM | POA: Insufficient documentation

## 2021-11-29 NOTE — ED Triage Notes (Signed)
Patient here POV from Home.  Endorses Falling after tripping on a Lifted Rug approximately 30-45 Minutes PTA. A table fell over and her Glasses injured her Nose causing a 2 cm Laceration to Same.  Bleeding Controlled. Bruising to Same. No LOC. No Anticoagulants.   NAD Noted during Triage. A&Ox4. GCS 15. Ambulatory.

## 2021-11-30 ENCOUNTER — Emergency Department (HOSPITAL_BASED_OUTPATIENT_CLINIC_OR_DEPARTMENT_OTHER)
Admission: EM | Admit: 2021-11-30 | Discharge: 2021-11-30 | Disposition: A | Discharge status: Home / Self Care | Payer: Medicare Other | Attending: Emergency Medicine | Admitting: Emergency Medicine

## 2021-11-30 DIAGNOSIS — S0181XA Laceration without foreign body of other part of head, initial encounter: Secondary | ICD-10-CM

## 2021-11-30 NOTE — ED Notes (Signed)
Pt brought back to room 1. Pt was ambulatory without assistance. Gait steady. Denies dizziness. Laceration to bridge of the nose covered with band aid, bleeding controlled.

## 2021-11-30 NOTE — ED Notes (Signed)
Discharge instructions were discussed with pt. Pt verbalized understanding with no questions at this time. Pt to wait in lobby for s/o.

## 2021-11-30 NOTE — ED Provider Notes (Signed)
[facility] EMERGENCY DEPT Provider Note  CSN: [id number] Arrival date & time: 11/29/21 2241  Chief Complaint(s) Fall  HPI Cheryl Mcpherson is a 71 y.o. female who presents after mechanical fall.  She reports tripping on a rug causing her to fall onto her buttocks.  While on the way down she attempted to brace herself with a table that fell forward and hit her on her nose.  She sustained a laceration to the bridge of the nose.  She is endorsing pain related to this as well as right elbow pain.  Denied any loss of consciousness.  Denies any neck pain or back pain.  No hip pain.  No other extremity pain.  Patient is not anticoagulated.  The history is provided by the patient.    Past Medical History Past Medical History:  Diagnosis Date   Arthritis    History of depression    Stable on no meds.   Hyperlipidemia    Takes simvastatin and omega-3 fatty acids.   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 Authorizing Provider  calcium carbonate (SUPER CALCIUM) 1500 (600 Ca) MG TABS tablet 1 tablet with meals    [provider]  Cholecalciferol (VITAMIN D3 GUMMIES ADULT PO) Take 2 tablets by mouth every evening. Total dose=2000 units    [provider]  Multiple Vitamins-Minerals (ADULT GUMMY PO) Take 2 tablets by mouth daily.    [provider]  Omega-3 Fatty Acids (OMEGA 3 PO) Take 1,040 mg by mouth daily.    [provider]  Psyllium (FIBER) 28.3 % POWD See admin instructions.    [provider]  simvastatin (ZOCOR) 20 MG tablet Take 20 mg by mouth every evening.    [provider]                                                                                                                                     Allergies Sulfa antibiotics  Review of Systems Review of Systems As noted in HPI  Physical Exam Vital Signs  I have reviewed the triage vital signs BP (!) 115/94 (BP Location: Right Arm)   Pulse (!) 55   Temp 97.7 F (36.5 C) (Oral)   Resp 20   Ht 5\' 6"  (1.676 m)   Wt 77.5 kg   LMP 12/28/1996   SpO2 96%   BMI 27.58 kg/m   Physical Exam Constitutional:      General: She is not in acute distress.    Appearance: She is well-developed. She is not diaphoretic.  HENT:     Head: Normocephalic and atraumatic.     Right  Ear: External ear normal.     Left Ear: External ear normal.     Nose: Nose normal.   Eyes:     General: No scleral icterus.       Right eye: No discharge.        Left eye: No discharge.     Conjunctiva/sclera: Conjunctivae normal.     Pupils: Pupils are equal, round, and reactive to light.  Cardiovascular:     Rate and Rhythm: Normal rate and regular rhythm.     Pulses:          Radial pulses are 2+ on the right side and 2+ on the left side.       Dorsalis pedis pulses are 2+ on the right side and 2+ on the left side.     Heart sounds: Normal heart sounds. No murmur heard.    No friction rub. No gallop.  Pulmonary:     Effort: Pulmonary effort is normal. No respiratory distress.     Breath sounds: Normal breath sounds. No stridor. No wheezing.  Abdominal:     General: There is no distension.     Palpations: Abdomen is soft.     Tenderness: There is no abdominal tenderness.  Musculoskeletal:        General: No tenderness.     Right elbow: No swelling, deformity, effusion or lacerations. Normal range of motion. No tenderness.       Arms:     Cervical back: Normal range of motion and neck supple. No bony tenderness.     Thoracic back: No bony tenderness.     Lumbar back: No bony tenderness.     Comments: Clavicles stable. Chest stable to AP/Lat  compression. Pelvis stable to Lat compression. No obvious extremity deformity. No chest or abdominal wall contusion.  Skin:    General: Skin is warm and dry.     Findings: No erythema or rash.  Neurological:     Mental Status: She is alert and oriented to person, place, and time.     Comments: Moving all extremities     ED Results and Treatments Labs (all labs ordered are listed, but only abnormal results are displayed) Labs Reviewed - No data to display                                                                                                                       EKG  EKG Interpretation  Date/Time:    Ventricular Rate:    PR Interval:    QRS Duration:   QT Interval:    QTC Calculation:   R Axis:     Text Interpretation:         Radiology No results found.  Pertinent labs & imaging results that were available during my care of the patient were reviewed by me and considered in my medical decision making (see MDM for details).  Medications Ordered in ED Medications - No data to display  Procedures .Marland KitchenLaceration Repair  Date/Time: 12/01/2021 11:55 AM  Performed by: Nira Conn, MD Authorized by: Nira Conn, MD   Consent:    Consent obtained:  Verbal   Consent given by:  Patient   Risks discussed:  Poor cosmetic result   Alternatives discussed:  No treatment Universal protocol:    Procedure explained and questions answered to patient or proxy's satisfaction: yes     Patient identity confirmed:  Verbally with patient Anesthesia:    Anesthesia method:  None Laceration details:    Location:  Face   Face location:  Nose   Length (cm):  2   Depth (mm):  1 Exploration:    Hemostasis achieved with:  Direct pressure   Wound extent: no fascia violation noted, no foreign bodies/material noted and no muscle damage  noted     Contaminated: no   Treatment:    Irrigation solution:  Sterile saline Skin repair:    Repair method:  Steri-Strips   Number of Steri-Strips:  1 Approximation:    Approximation:  Close Repair type:    Repair type:  Simple Post-procedure details:    Dressing:  Adhesive bandage   Procedure completion:  Tolerated   (including critical care time)  Medical Decision Making / ED Course     ED Course:    Assessment, Add'l Intervention, and Reassessment: Mechanical fall resulting in nasal bridge laceration. Laceration irrigated and closed as above No need for head CT as there is low concern for ICH. Right elbow pain, resolved.  Patient does have a contusion that is nontender. No concern for fracture or dislocation.    Final Clinical Impression(s) / ED Diagnoses Final diagnoses:  Facial laceration, initial encounter  The patient appears reasonably screened and/or stabilized for discharge and I doubt any other medical condition or other [facility] requiring further screening, evaluation, or treatment in the ED at this time prior to discharge. Safe for discharge with strict return precautions.  Disposition: Discharge  Condition: Good  I have discussed the results, Dx and Tx plan with the patient/family who expressed understanding and agree(s) with the plan. Discharge instructions discussed at length. The patient/family was given strict return precautions who verbalized understanding of the instructions. No further questions at time of discharge.    ED Discharge Orders     None        Follow Up: Shirlyn Goltz [address] [place] [address] [phone]  Call  as needed            This chart was dictated using voice recognition software.  Despite best efforts to proofread,  errors can occur which can change the documentation meaning.    Nira Conn, MD 12/01/21 [phone]

## 2022-02-10 ENCOUNTER — Other Ambulatory Visit: Payer: Self-pay | Admitting: Diagnostic Radiology

## 2022-02-10 DIAGNOSIS — Z1231 Encounter for screening mammogram for malignant neoplasm of breast: Secondary | ICD-10-CM

## 2022-03-15 ENCOUNTER — Ambulatory Visit
Admission: RE | Admit: 2022-03-15 | Discharge: 2022-03-15 | Disposition: A | Discharge status: Home / Self Care | Payer: Medicare Other | Source: Ambulatory Visit | Attending: Diagnostic Radiology | Admitting: Diagnostic Radiology

## 2022-03-15 DIAGNOSIS — Z1231 Encounter for screening mammogram for malignant neoplasm of breast: Secondary | ICD-10-CM

## 2022-05-06 ENCOUNTER — Ambulatory Visit: Payer: Medicare Other | Attending: Cardiology | Admitting: Cardiology

## 2022-05-06 ENCOUNTER — Encounter: Payer: Self-pay | Admitting: Cardiology

## 2022-05-06 VITALS — BP 122/82 | HR 46 | Ht 66.0 in | Wt 187.6 lb

## 2022-05-06 DIAGNOSIS — R001 Bradycardia, unspecified: Secondary | ICD-10-CM

## 2022-05-06 NOTE — Patient Instructions (Signed)

## 2022-05-06 NOTE — Assessment & Plan Note (Addendum)
Doing remarkably well.  Continues to have resting low heart rate, but is still asymptomatic.  On her Fitbit easily able to achieve heart rates over 100 bpm.  Exercising routinely.  Has lost weight.  Feels great no signs or symptoms to suggest chronotropic incompetence, fatigue, or syncope or near syncope.  At this point I think we simply monitor her for signs of concerning features for bradycardia, but there is no indication at this point for BPM.  Will continue to follow-up on an annual basis.

## 2022-05-06 NOTE — Progress Notes (Signed)
Primary Care Provider: Orpah Melter, [place] Cardiologist: Glenetta Hew, MD Electrophysiologist: None  Clinic Note: Chief Complaint  Patient presents with   Follow-up    74-monthfollow-up to reassess bradycardia.    ===================================  ASSESSMENT/PLAN   Problem List Items Addressed This Visit     Sinus bradycardia by electrocardiogram - Primary (Chronic)    Doing remarkably well.  Continues to have resting low heart rate, but is still asymptomatic.  On her Fitbit easily able to achieve heart rates over 100 bpm.  Exercising routinely.  Has lost weight.  Feels great no signs or symptoms to suggest chronotropic incompetence, fatigue, or syncope or near syncope.  At this point I think we simply monitor her for signs of concerning features for bradycardia, but there is no indication at this point for BPM.  Will continue to follow-up on an annual basis.      Relevant Orders   EKG 12-Lead    ===================================  HPI:   Cheryl Fiferis a 71y.o. female with a PMH notable for basically hyperlipidemia and previous obesity who is being seen today for the evaluation of SINUS BRADYCARDIA at the request of Stamey, HGirtha Rm FNP.   Cheryl Curbwas seen on November 19, 2021 by HKarren Cobble FNP for routine follow-up.  She noted about a 50 pound weight loss over the past year or so after cleaning up her diet and getting at least 10,000 12,000 steps a day.  She feels much better since the weight loss.  However when which she was checking her vital signs her heart rates were noted to be in the 30s.  She noted on her Fitbit her heart rates often run in the 40s to 50s over the last month or so.  No symptoms. => Referred for cardiology=> She was seen for initial consultation on June 28.  Noted to have first-degree AV block and sinus bradycardia.  Completely asymptomatic.  On Fitbit, it was a heart rate in the 120s.  Therefore did not  appear to have chronotropic incompetence.  And no sensation of palpitations or fatigue.  Plan was to avoid any AV nodal agents, continue exercise and reassess in 6 months.   Recent Hospitalizations: None  Reviewed  CV studies:    The following studies were reviewed today: (if available, images/films reviewed: From Epic Chart or Care Everywhere) None  Interval History:   Cheryl Robbenis here for follow-up. Feeling well. Denies any complaints or concerns. Wears her fitbit. Continues to walk.   Had lost 50 lbs, but gained 10 lbs back. Fitbit app shows HR range 42-49, resting is 46-47 BPM. She walks at least 10,000 steps a day.  She notes HR does go above 90 (goes up to 120) when she does more intense walking or uses the elliptical.  No CP, palpitations, wooziness, dizziness, lightheadedness. She drinks decaffeinated drinks.  CV Review of Symptoms (Summary): Cardiovascular ROS: negative for - chest pain, irregular heartbeat, loss of consciousness, or shortness of breath  REVIEWED OF SYSTEMS   Completed by attending Review of Systems  Constitutional:  Negative for malaise/fatigue and weight loss (Had lost up to 50 pounds, unfortunately, she gained back 10.).  Respiratory:  Negative for shortness of breath.   Cardiovascular:  Negative for palpitations, orthopnea, leg swelling and PND.  Gastrointestinal:  Negative for blood in stool and melena.  Genitourinary:  Negative for hematuria.  Musculoskeletal:  Negative for joint pain.  Neurological:  Negative for dizziness  and weakness.  Psychiatric/Behavioral:  Negative for depression and memory loss. The patient is not nervous/anxious and does not have insomnia.     I have reviewed and (if needed) personally updated the patient's problem list, medications, allergies, past medical and surgical history, social and family history.   PAST MEDICAL HISTORY   Past Medical History:  Diagnosis Date   Arthritis    History of depression     Stable on no meds.   Hyperlipidemia    Takes simvastatin and omega-3 fatty acids.   Hyperparathyroidism , secondary, non-renal ([place])    Started on Fosamax   Osteopenia     PAST SURGICAL HISTORY   Past Surgical History:  Procedure Laterality Date   BREAST BIOPSY Left 04/2017   BUNIONECTOMY Bilateral Rt foot 11/2005; left foot 06/2006   CATARACT EXTRACTION Bilateral 2021   Right-March; left-April 2021   REVERSE SHOULDER ARTHROPLASTY Right 02/08/2018   Procedure: REVERSE RIGHT SHOULDER ARTHROPLASTY;  Surgeon: Justice Britain, MD;  Location: [facility];  Service: Orthopedics;  Laterality: Right;   TONSILLECTOMY      Immunization History  Administered Date(s) Administered   Tdap 01/18/2015    MEDICATIONS/ALLERGIES   Current Meds  Medication Sig   Bacillus Coagulans-Inulin (ALIGN PREBIOTIC-PROBIOTIC PO) Take by mouth.   calcium carbonate (SUPER CALCIUM) 1500 (600 Ca) MG TABS tablet 1 tablet with meals   Cholecalciferol (VITAMIN D3 GUMMIES ADULT PO) Take 2 tablets by mouth every evening. Total dose=2000 units   Multiple Vitamins-Minerals (ADULT GUMMY PO) Take 2 tablets by mouth daily.   Omega-3 Fatty Acids (OMEGA 3 PO) Take 1,040 mg by mouth daily.   Psyllium (FIBER) 28.3 % POWD See admin instructions.   simvastatin (ZOCOR) 20 MG tablet Take 20 mg by mouth every evening.    Allergies  Allergen Reactions   Sulfa Antibiotics Anaphylaxis and Swelling    FACIAL SWELLING    SOCIAL HISTORY/FAMILY HISTORY   Reviewed in Epic:  Pertinent findings:  Social History   Tobacco Use   Smoking status: Former    Packs/day: 1.00    Years: 20.00    Total pack years: 20.00    Types: Cigarettes    Quit date: 12/29/2007    Years since quitting: 14.3   Smokeless tobacco: Never  Vaping Use   Vaping Use: Never used  Substance Use Topics   Alcohol use: Yes    Comment: rarely   Drug use: No   Social History   Social History Narrative   She is currently retired   Former smoker with rare  alcohol use.   Has pretty much cut out caffeine.   Restrict yourself to a 1000-1400 -calorie/day diet.   Exercises walking 10,000-12,000 steps daily.    OBJCTIVE -PE, EKG, labs   Wt Readings from Last 3 Encounters:  05/06/22 187 lb 9.6 oz (85.1 kg)  11/29/21 170 lb 13.7 oz (77.5 kg)  11/24/21 170 lb 12.8 oz (77.5 kg)    Physical Exam: Performed by attending BP 122/82 (BP Location: Left Arm, Patient Position: Sitting, Cuff Size: Large)   Pulse (!) 46   Ht '5\' 6"'$  (1.676 m)   Wt 187 lb 9.6 oz (85.1 kg)   LMP 12/28/1996   SpO2 98%   BMI 30.28 kg/m  Physical Exam Constitutional:      General: She is not in acute distress.    Appearance: Normal appearance. She is obese. She is not ill-appearing (Healthy-appearing.  Well-nourished and well-groomed.) or toxic-appearing.     Comments: Mildly obese  HENT:     Head: Normocephalic and atraumatic.  Neck:     Vascular: No carotid bruit.  Cardiovascular:     Rate and Rhythm: Regular rhythm. Bradycardia present.     Pulses: Normal pulses.     Heart sounds: Normal heart sounds. No murmur heard.    No friction rub. No gallop.  Pulmonary:     Effort: Pulmonary effort is normal. No respiratory distress.     Breath sounds: No wheezing or rales.  Chest:     Chest wall: No tenderness.  Musculoskeletal:        General: No swelling. Normal range of motion.     Cervical back: Normal range of motion and neck supple.  Skin:    General: Skin is warm and dry.  Neurological:     General: No focal deficit present.     Mental Status: She is alert and oriented to person, place, and time.     Gait: Gait normal.  Psychiatric:        Mood and Affect: Mood normal.        Behavior: Behavior normal.        Thought Content: Thought content normal.        Judgment: Judgment normal.      Adult ECG Report  Rate: 46 ;  Rhythm: sinus bradycardia;   Narrative Interpretation: Sinus bradycardia without other ST/T wave changes Reviewed and signed by  attending.  Recent Labs:    11/19/2021: TC 193, TG 99, HDL 53, LDL 122.  Hgb 14.  CR 0.71, K+ 3.6.  TSH 2.42. No results found for: "CHOL", "HDL", "LDLCALC", "LDLDIRECT", "TRIG", "CHOLHDL" No results found for: "CREATININE", "BUN", "NA", "K", "CL", "CO2"   No results found for: "HGBA1C" No results found for: "TSH"  ==================================================  Resident time with patient 15 minutes, attending time 5 minutes (independent of resident). Total time charting 11 minutes.  Total 31 minutes. Current medicines are reviewed at length with the patient today.  (+/- concerns) N/A  Notice: This dictation was prepared with Dragon dictation along with smart phrase technology. Any transcriptional errors that result from this process are unintentional and may not be corrected upon review.  Studies Ordered:   Orders Placed This Encounter  Procedures   EKG 12-Lead   No orders of the defined types were placed in this encounter.   Patient Instructions / Medication Changes & Studies & Tests Ordered   Patient Instructions  Medication Instructions:  No changes   *If you need a refill on your cardiac medications before your next appointment, please call your pharmacy*   Lab Work: Not needed    Testing/Procedures:  Not needed  Follow-Up: At [facility], you and your health needs are our priority.  As part of our continuing mission to provide you with exceptional heart care, we have created designated Provider Care Teams.  These Care Teams include your primary Cardiologist (physician) and Advanced Practice Providers (APPs -  Physician Assistants and Nurse Practitioners) who all work together to provide you with the care you need, when you need it.     Your next appointment:   12 month(s)  The format for your next appointment:   In Person  Provider:   Glenetta Hew, MD     Signed:  Orvis Brill, DO [facility], [place]  I have seen,  examined and evaluated the patient along with the Resident Physician in clinic today.  I personally performed my own interview & exanimation.  After  reviewing all the available data and chart, we discussed the patients laboratory, study & physical findings as well as symptoms in detail. I agree with her findings, examination as well as impression recommendations as per our discussion.    Attending adjustments int the full clinic noted annotated in [place].     Leonie Man, MD, MS Glenetta Hew, M.D., M.S. Interventional Cardiologist  [place]  Pager # [phone] Phone # [phone] [address] [place], [place] [address]       Thank you for choosing [facility]!!

## 2022-05-06 NOTE — Progress Notes (Signed)
ATTENDING ATTESTATION  I have seen, examined and evaluated the patient along with the Resident Physician (Dr [place] Shark) in clinic today.  I personally performed my own interview & exanimation.  After reviewing all the available data and chart, we discussed the patients laboratory, study & physical findings as well as symptoms in detail. I agree with her findings, examination as well as impression recommendations as per our discussion.    Attending adjustments int the full clinic noted annotated in [place].   Problem List Items Addressed This Visit     Sinus bradycardia by electrocardiogram - Primary (Chronic)    Doing remarkably well.  Continues to have resting low heart rate, but is still asymptomatic.  On her Fitbit easily able to achieve heart rates over 100 bpm.  Exercising routinely.  Has lost weight.  Feels great no signs or symptoms to suggest chronotropic incompetence, fatigue, or syncope or near syncope.  At this point I think we simply monitor her for signs of concerning features for bradycardia, but there is no indication at this point for BPM.  Will continue to follow-up on an annual basis.      Relevant Orders   EKG 12-Lead      Leonie Man, MD, MS Glenetta Hew, M.D., M.S. Interventional Cardiologist  [place]  Pager # [phone] Phone # [phone] [address] [place], [place] [address]

## 2022-08-21 DIAGNOSIS — R300 Dysuria: Secondary | ICD-10-CM | POA: Diagnosis not present

## 2022-08-21 DIAGNOSIS — R350 Frequency of micturition: Secondary | ICD-10-CM | POA: Diagnosis not present

## 2022-09-02 DIAGNOSIS — N390 Urinary tract infection, site not specified: Secondary | ICD-10-CM | POA: Diagnosis not present

## 2022-09-12 DIAGNOSIS — N390 Urinary tract infection, site not specified: Secondary | ICD-10-CM | POA: Diagnosis not present

## 2022-09-12 DIAGNOSIS — Z87440 Personal history of urinary (tract) infections: Secondary | ICD-10-CM | POA: Diagnosis not present

## 2022-10-14 DIAGNOSIS — H26493 Other secondary cataract, bilateral: Secondary | ICD-10-CM | POA: Diagnosis not present

## 2022-10-14 DIAGNOSIS — H0102B Squamous blepharitis left eye, upper and lower eyelids: Secondary | ICD-10-CM | POA: Diagnosis not present

## 2022-10-14 DIAGNOSIS — H5202 Hypermetropia, left eye: Secondary | ICD-10-CM | POA: Diagnosis not present

## 2022-10-14 DIAGNOSIS — H524 Presbyopia: Secondary | ICD-10-CM | POA: Diagnosis not present

## 2022-10-14 DIAGNOSIS — H52223 Regular astigmatism, bilateral: Secondary | ICD-10-CM | POA: Diagnosis not present

## 2022-10-14 DIAGNOSIS — H43813 Vitreous degeneration, bilateral: Secondary | ICD-10-CM | POA: Diagnosis not present

## 2022-11-23 DIAGNOSIS — E039 Hypothyroidism, unspecified: Secondary | ICD-10-CM | POA: Diagnosis not present

## 2022-11-23 DIAGNOSIS — E782 Mixed hyperlipidemia: Secondary | ICD-10-CM | POA: Diagnosis not present

## 2022-11-23 DIAGNOSIS — Z0000 Encounter for general adult medical examination without abnormal findings: Secondary | ICD-10-CM | POA: Diagnosis not present

## 2022-11-23 DIAGNOSIS — N390 Urinary tract infection, site not specified: Secondary | ICD-10-CM | POA: Diagnosis not present

## 2022-11-23 DIAGNOSIS — R001 Bradycardia, unspecified: Secondary | ICD-10-CM | POA: Diagnosis not present

## 2022-11-23 DIAGNOSIS — Z9181 History of falling: Secondary | ICD-10-CM | POA: Diagnosis not present

## 2022-11-24 DIAGNOSIS — R7303 Prediabetes: Secondary | ICD-10-CM | POA: Diagnosis not present

## 2022-11-29 ENCOUNTER — Other Ambulatory Visit: Payer: Self-pay | Admitting: Family Medicine

## 2022-11-29 DIAGNOSIS — M8580 Other specified disorders of bone density and structure, unspecified site: Secondary | ICD-10-CM

## 2022-11-30 DIAGNOSIS — D175 Benign lipomatous neoplasm of intra-abdominal organs: Secondary | ICD-10-CM | POA: Diagnosis not present

## 2022-11-30 DIAGNOSIS — K5730 Diverticulosis of large intestine without perforation or abscess without bleeding: Secondary | ICD-10-CM | POA: Diagnosis not present

## 2022-11-30 DIAGNOSIS — K648 Other hemorrhoids: Secondary | ICD-10-CM | POA: Diagnosis not present

## 2022-11-30 DIAGNOSIS — Z09 Encounter for follow-up examination after completed treatment for conditions other than malignant neoplasm: Secondary | ICD-10-CM | POA: Diagnosis not present

## 2022-11-30 DIAGNOSIS — Z86010 Personal history of colonic polyps: Secondary | ICD-10-CM | POA: Diagnosis not present

## 2023-01-25 DIAGNOSIS — E039 Hypothyroidism, unspecified: Secondary | ICD-10-CM | POA: Diagnosis not present

## 2023-02-06 ENCOUNTER — Other Ambulatory Visit: Payer: Self-pay | Admitting: Family Medicine

## 2023-02-06 DIAGNOSIS — Z1231 Encounter for screening mammogram for malignant neoplasm of breast: Secondary | ICD-10-CM

## 2023-03-20 ENCOUNTER — Ambulatory Visit
Admission: RE | Admit: 2023-03-20 | Discharge: 2023-03-20 | Disposition: A | Discharge status: Home / Self Care | Payer: Medicare Other | Source: Ambulatory Visit | Attending: Family Medicine | Admitting: Family Medicine

## 2023-03-20 DIAGNOSIS — Z1231 Encounter for screening mammogram for malignant neoplasm of breast: Secondary | ICD-10-CM | POA: Diagnosis not present

## 2023-03-23 ENCOUNTER — Other Ambulatory Visit: Payer: Self-pay | Admitting: Family Medicine

## 2023-03-23 DIAGNOSIS — R928 Other abnormal and inconclusive findings on diagnostic imaging of breast: Secondary | ICD-10-CM

## 2023-04-01 ENCOUNTER — Ambulatory Visit
Admission: RE | Admit: 2023-04-01 | Discharge: 2023-04-01 | Disposition: A | Discharge status: Home / Self Care | Payer: Medicare Other | Source: Ambulatory Visit | Attending: Family Medicine | Admitting: Family Medicine

## 2023-04-01 DIAGNOSIS — R928 Other abnormal and inconclusive findings on diagnostic imaging of breast: Secondary | ICD-10-CM

## 2023-04-01 DIAGNOSIS — N6002 Solitary cyst of left breast: Secondary | ICD-10-CM | POA: Diagnosis not present

## 2023-05-25 DIAGNOSIS — J069 Acute upper respiratory infection, unspecified: Secondary | ICD-10-CM | POA: Diagnosis not present

## 2023-05-25 DIAGNOSIS — R051 Acute cough: Secondary | ICD-10-CM | POA: Diagnosis not present

## 2023-05-25 DIAGNOSIS — Z03818 Encounter for observation for suspected exposure to other biological agents ruled out: Secondary | ICD-10-CM | POA: Diagnosis not present

## 2023-06-22 ENCOUNTER — Ambulatory Visit
Admission: RE | Admit: 2023-06-22 | Discharge: 2023-06-22 | Disposition: A | Discharge status: Home / Self Care | Payer: Medicare Other | Source: Ambulatory Visit | Attending: Family Medicine | Admitting: Family Medicine

## 2023-06-22 DIAGNOSIS — M8580 Other specified disorders of bone density and structure, unspecified site: Secondary | ICD-10-CM

## 2023-06-22 DIAGNOSIS — M8588 Other specified disorders of bone density and structure, other site: Secondary | ICD-10-CM | POA: Diagnosis not present

## 2023-06-25 IMAGING — MG MM DIGITAL DIAGNOSTIC UNILAT*L* W/ TOMO W/ CAD
4 series · 4 of 12 positions shown · non-contrast
Comparison: Previous exam(s).

CLINICAL DATA: 70-year-old female recalled from screening mammogram
dated 03/10/2021 for a possible left breast asymmetry.

EXAM:
DIGITAL DIAGNOSTIC UNILATERAL LEFT MAMMOGRAM WITH TOMOSYNTHESIS AND
CAD; ULTRASOUND LEFT BREAST LIMITED
TECHNIQUE: Left digital diagnostic mammography and breast tomosynthesis was
performed. The images were evaluated with computer-aided detection.;
Targeted ultrasound examination of the left breast was performed.

[L MLO synth-2D]
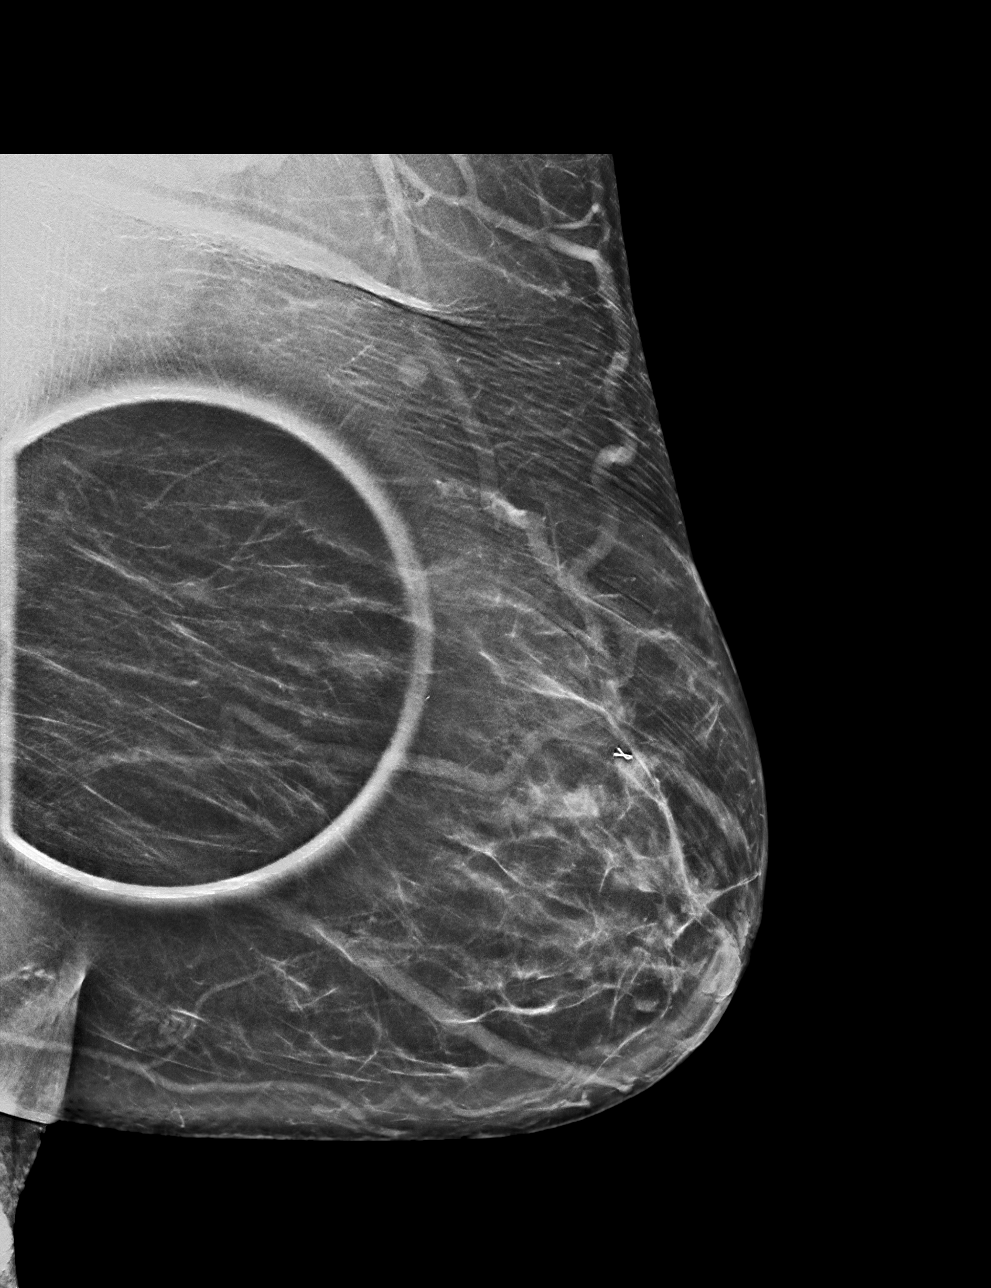

[L ML synth-2D]
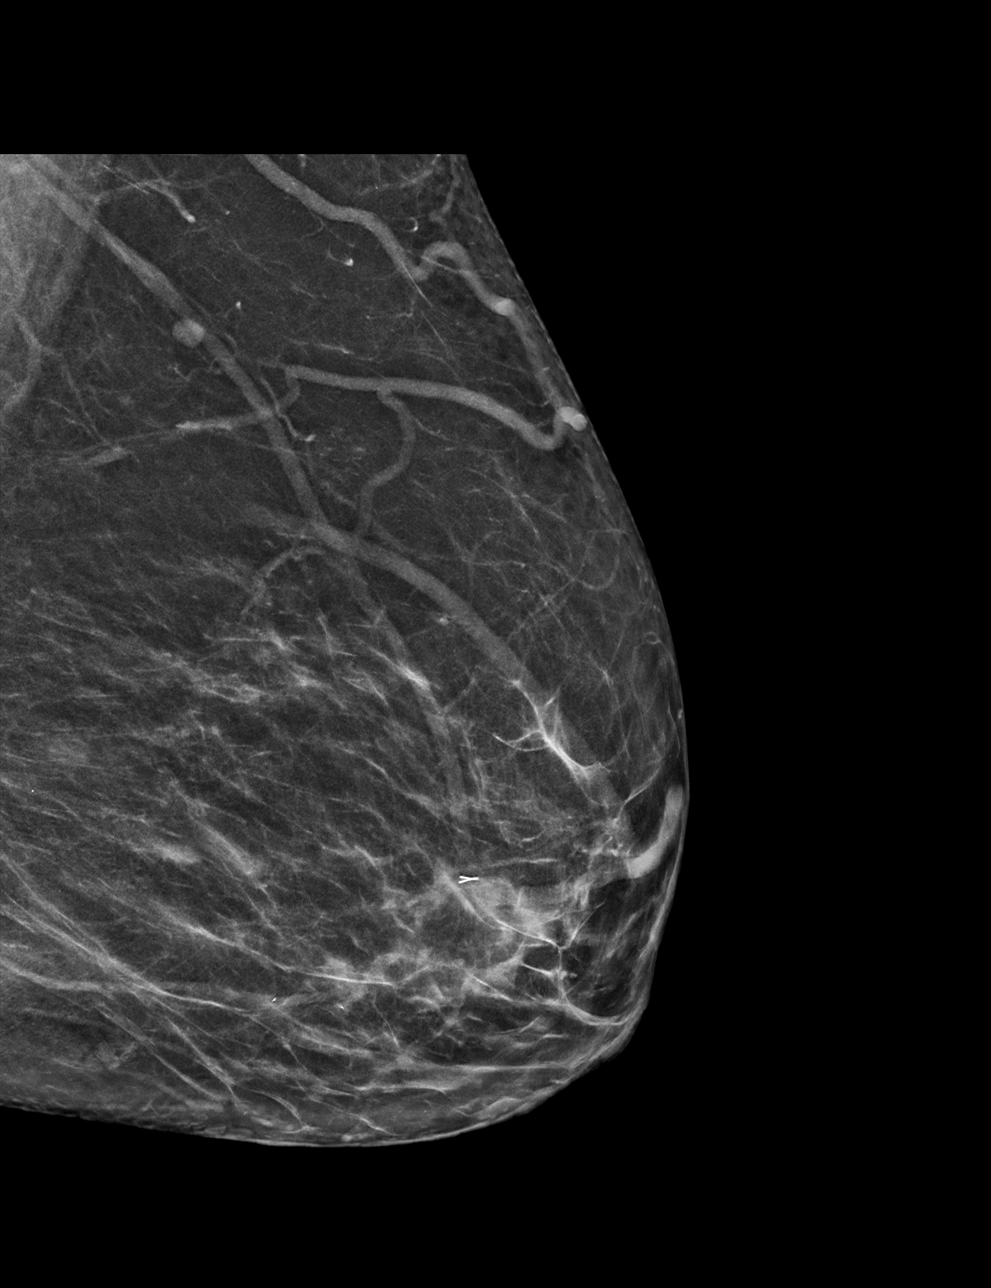

[L ML tomo · tomo slice 31/61.0]
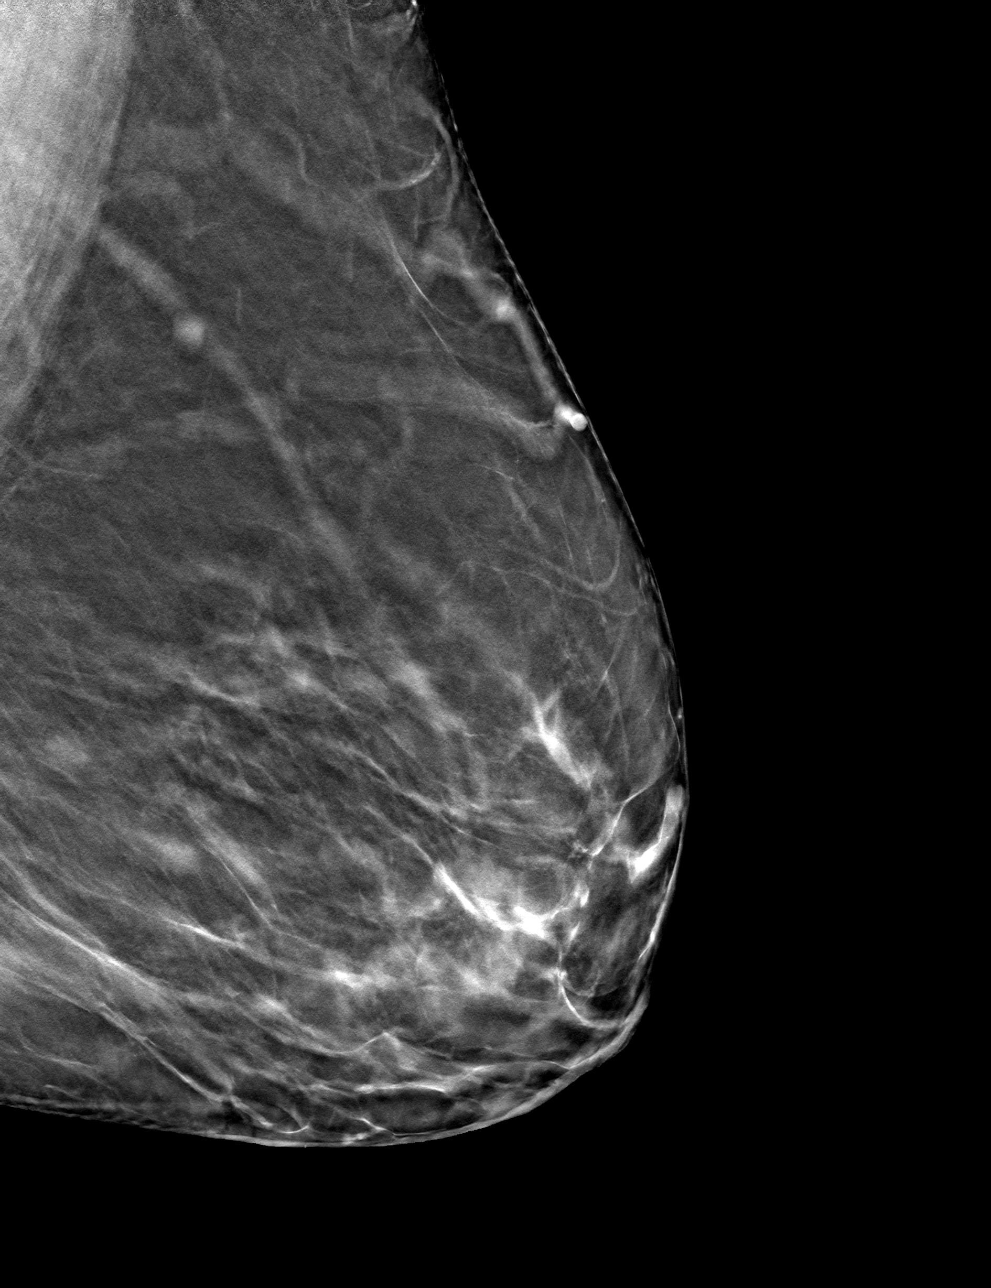

[L MLO tomo · tomo slice 37/74.0]
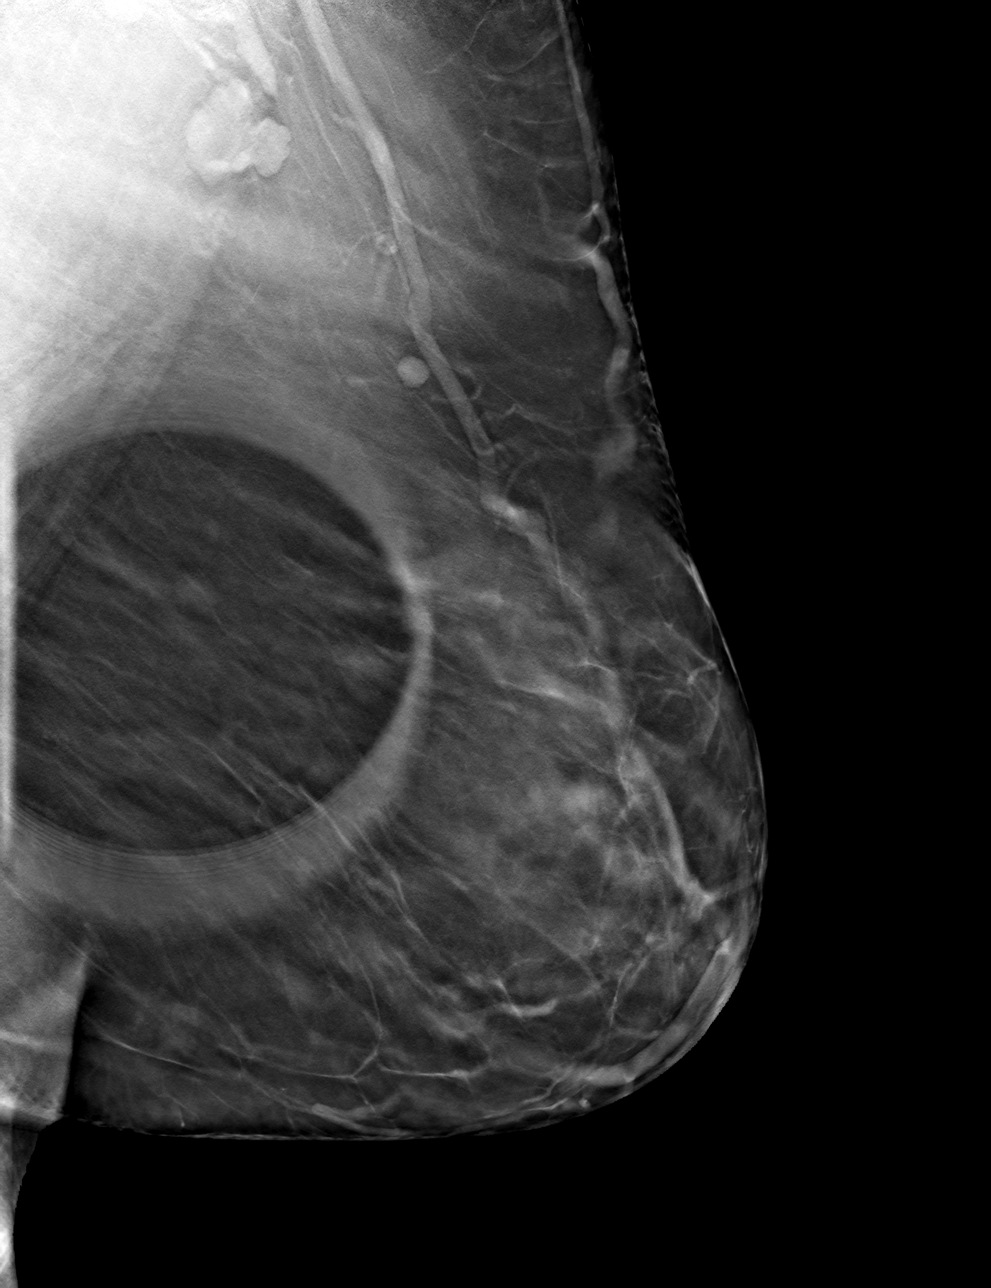

[4 of 12 positions shown; findings below may reference images not displayed]

ACR Breast Density Category b: There are scattered areas of
fibroglandular density.
FINDINGS: There is a persistent masslike asymmetry in the lateral left breast
at posterior depth seen on the MLO projection only. Further
evaluation with ultrasound was performed.

Targeted ultrasound is performed, showing an oval, circumscribed
hypoechoic mass at the 3 o'clock position 10 cm from the nipple. It
measures 7 x 2 x 5 mm. There is no internal vascularity. This likely
corresponds with the mammographic finding.
IMPRESSION: Probably benign left breast mass likely corresponding with the
screening mammographic findings. Recommendation is for short-term
imaging follow-up.

RECOMMENDATION:
Diagnostic left breast mammogram and ultrasound in 6 months.

I have discussed the findings and recommendations with the patient.
If applicable, a reminder letter will be sent to the patient
regarding the next appointment.

BI-RADS CATEGORY  3: Probably benign.

## 2023-06-25 IMAGING — US US BREAST*L* LIMITED INC AXILLA
1 series · 5 of 5 positions shown · non-contrast
Comparison: Previous exam(s).

CLINICAL DATA: 70-year-old female recalled from screening mammogram
dated 03/10/2021 for a possible left breast asymmetry.

EXAM:
DIGITAL DIAGNOSTIC UNILATERAL LEFT MAMMOGRAM WITH TOMOSYNTHESIS AND
CAD; ULTRASOUND LEFT BREAST LIMITED
TECHNIQUE: Left digital diagnostic mammography and breast tomosynthesis was
performed. The images were evaluated with computer-aided detection.;
Targeted ultrasound examination of the left breast was performed.

[Series 1: us breast*left* limited inc axilla · 0.06mm/px · 5 of 5 slices shown]
[im 1/5]
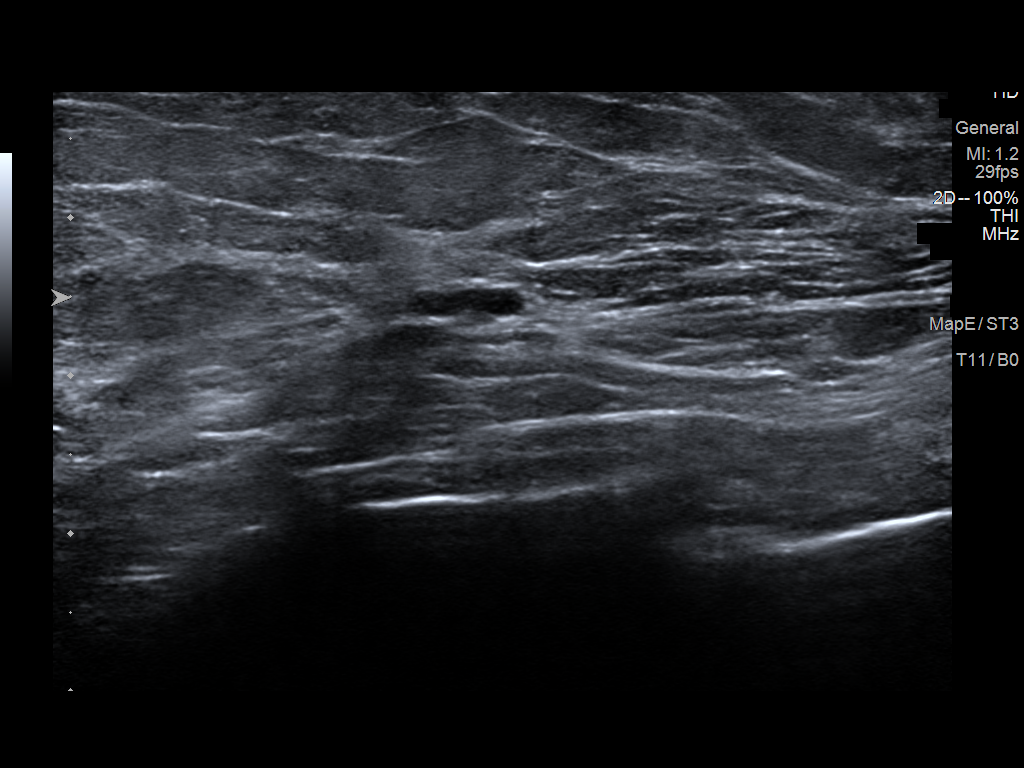
[im 2/5]
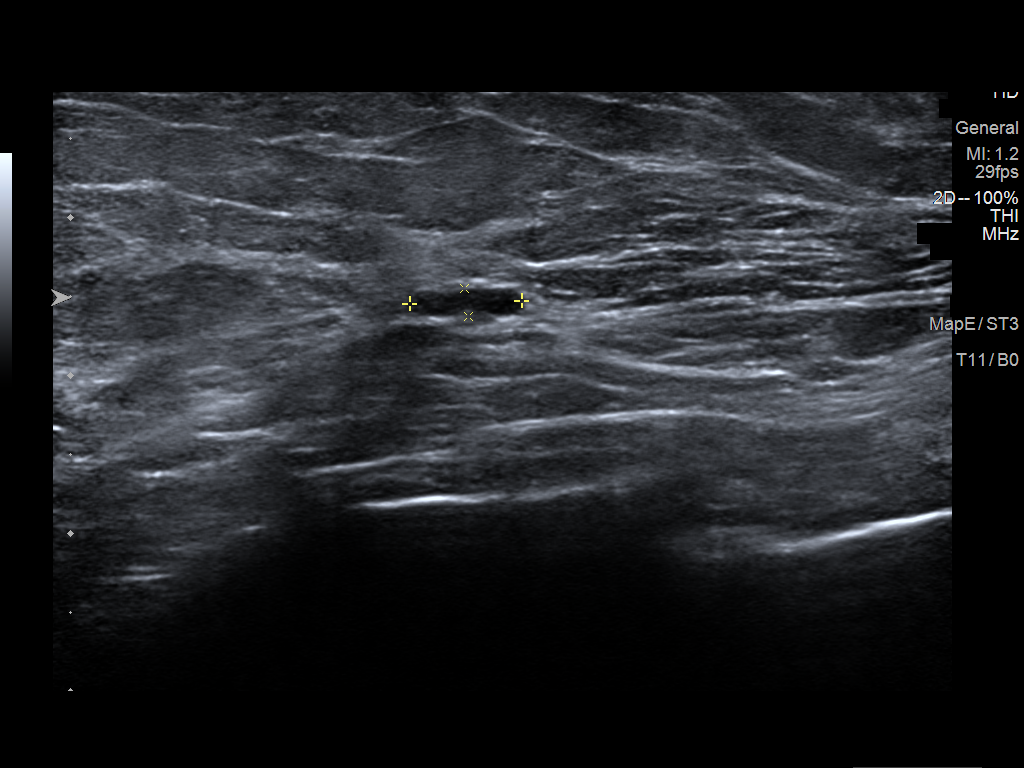
[im 3/5]
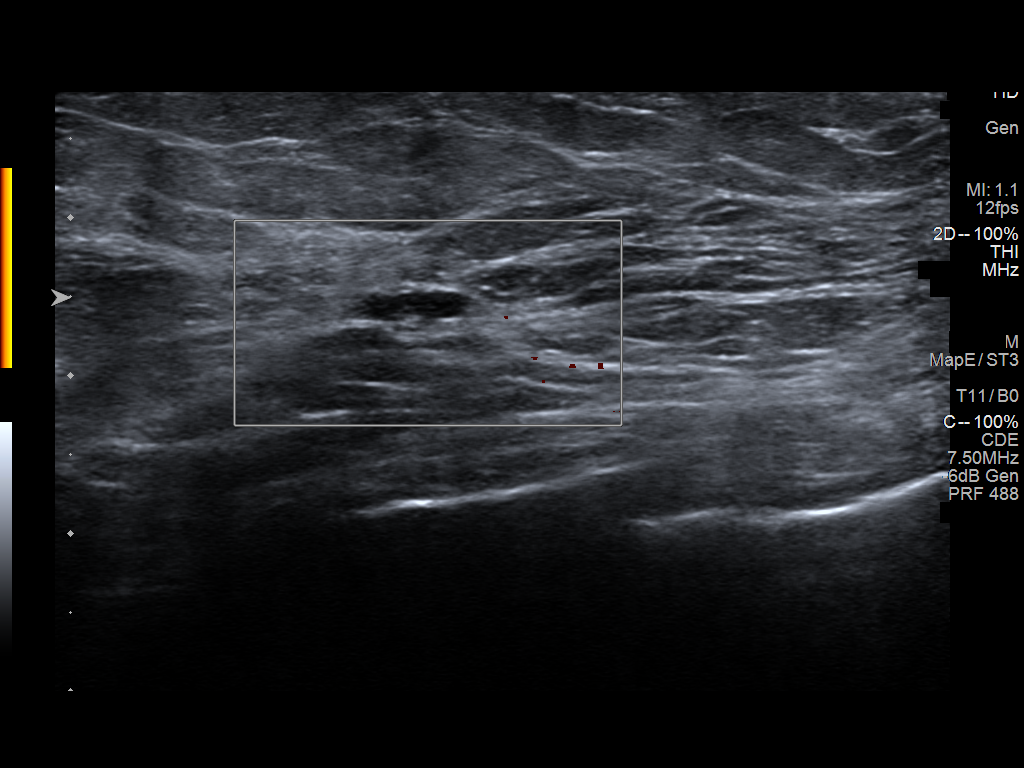
[im 4/5]
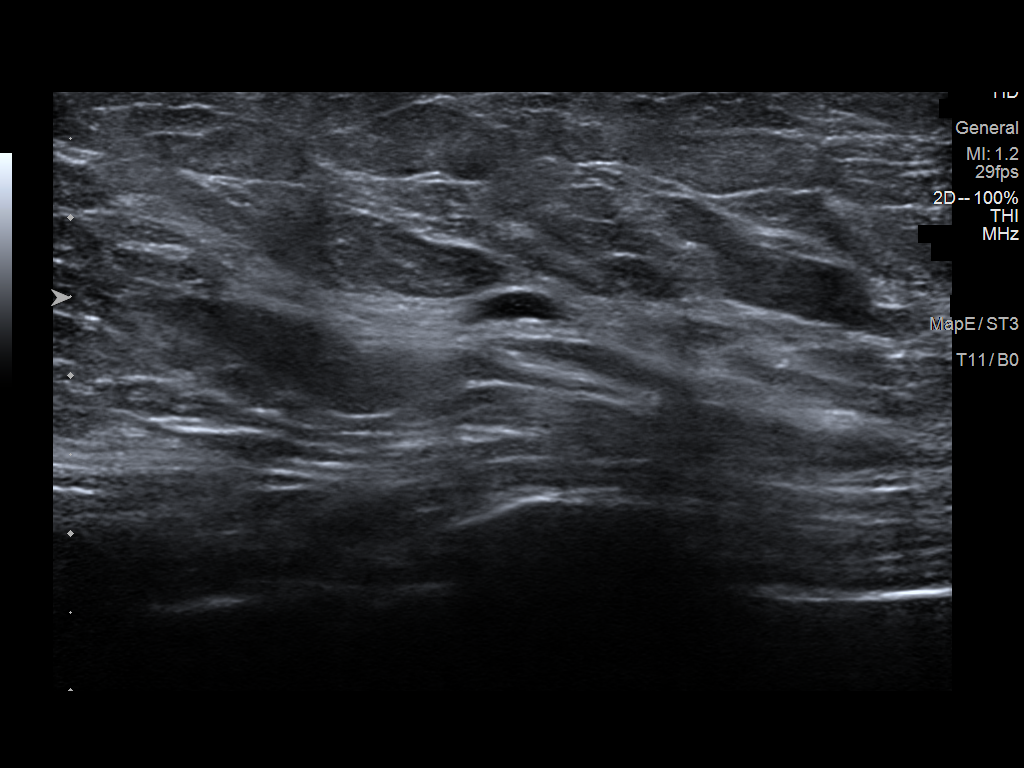
[im 5/5]
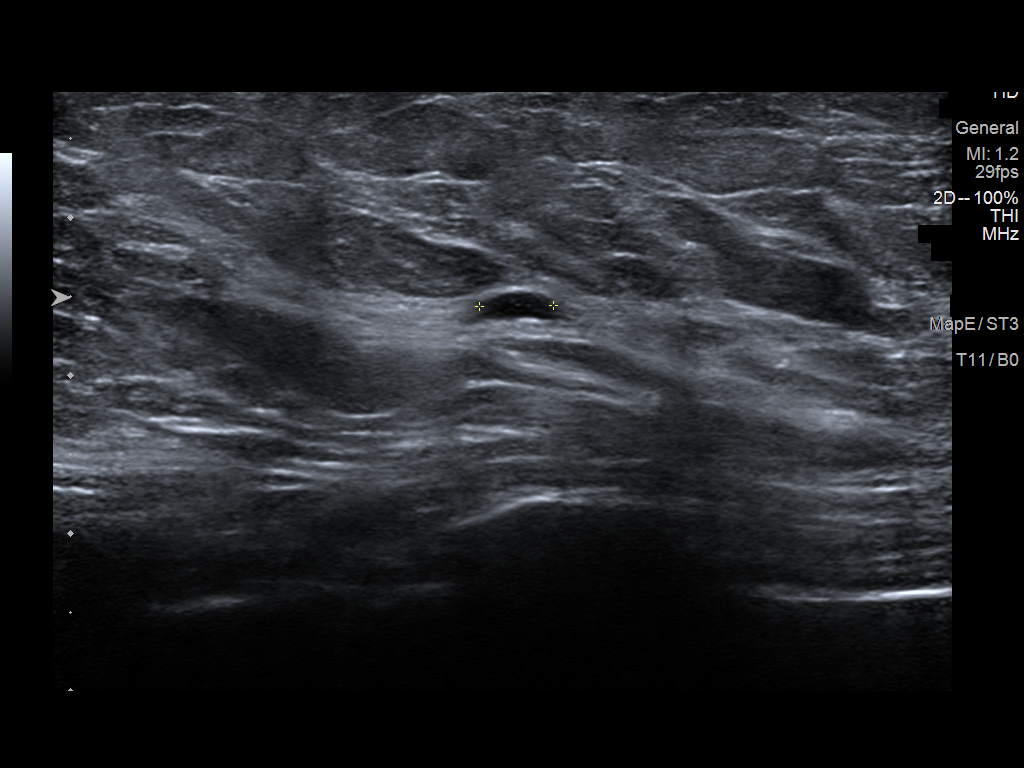

[5 of 5 positions shown; findings below may reference images not displayed]

ACR Breast Density Category b: There are scattered areas of
fibroglandular density.
FINDINGS: There is a persistent masslike asymmetry in the lateral left breast
at posterior depth seen on the MLO projection only. Further
evaluation with ultrasound was performed.

Targeted ultrasound is performed, showing an oval, circumscribed
hypoechoic mass at the 3 o'clock position 10 cm from the nipple. It
measures 7 x 2 x 5 mm. There is no internal vascularity. This likely
corresponds with the mammographic finding.
IMPRESSION: Probably benign left breast mass likely corresponding with the
screening mammographic findings. Recommendation is for short-term
imaging follow-up.

RECOMMENDATION:
Diagnostic left breast mammogram and ultrasound in 6 months.

I have discussed the findings and recommendations with the patient.
If applicable, a reminder letter will be sent to the patient
regarding the next appointment.

BI-RADS CATEGORY  3: Probably benign.

## 2023-12-08 DIAGNOSIS — M8580 Other specified disorders of bone density and structure, unspecified site: Secondary | ICD-10-CM | POA: Diagnosis not present

## 2023-12-08 DIAGNOSIS — H6120 Impacted cerumen, unspecified ear: Secondary | ICD-10-CM | POA: Diagnosis not present

## 2023-12-08 DIAGNOSIS — E039 Hypothyroidism, unspecified: Secondary | ICD-10-CM | POA: Diagnosis not present

## 2023-12-08 DIAGNOSIS — N390 Urinary tract infection, site not specified: Secondary | ICD-10-CM | POA: Diagnosis not present

## 2023-12-08 DIAGNOSIS — Z0000 Encounter for general adult medical examination without abnormal findings: Secondary | ICD-10-CM | POA: Diagnosis not present

## 2023-12-08 DIAGNOSIS — M25552 Pain in left hip: Secondary | ICD-10-CM | POA: Diagnosis not present

## 2023-12-08 DIAGNOSIS — E782 Mixed hyperlipidemia: Secondary | ICD-10-CM | POA: Diagnosis not present

## 2023-12-13 DIAGNOSIS — H6121 Impacted cerumen, right ear: Secondary | ICD-10-CM | POA: Diagnosis not present

## 2024-02-14 ENCOUNTER — Other Ambulatory Visit: Payer: Self-pay | Admitting: Family Medicine

## 2024-02-14 DIAGNOSIS — Z1231 Encounter for screening mammogram for malignant neoplasm of breast: Secondary | ICD-10-CM

## 2024-03-20 ENCOUNTER — Ambulatory Visit
Admission: RE | Admit: 2024-03-20 | Discharge: 2024-03-20 | Disposition: A | Discharge status: Home / Self Care | Source: Ambulatory Visit

## 2024-03-20 DIAGNOSIS — Z1231 Encounter for screening mammogram for malignant neoplasm of breast: Secondary | ICD-10-CM
